# Patient Record
Sex: Male | Born: 2000 | Race: White | Hispanic: No | Marital: Single | Smoking: Current every day smoker
Health system: Southern US, Community
[De-identification: ages and names within clinical notes are randomized; demographics above are authoritative.]

## PROBLEM LIST (undated history)

## (undated) DIAGNOSIS — K253 Acute gastric ulcer without hemorrhage or perforation: Secondary | ICD-10-CM

## (undated) DIAGNOSIS — J45909 Unspecified asthma, uncomplicated: Secondary | ICD-10-CM

## (undated) HISTORY — PX: TONSILLECTOMY: SUR1361

---
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
## 2022-01-25 ENCOUNTER — Other Ambulatory Visit (HOSPITAL_COMMUNITY): Payer: Self-pay | Admitting: Physician Assistant

## 2022-01-25 DIAGNOSIS — F902 Attention-deficit hyperactivity disorder, combined type: Secondary | ICD-10-CM

## 2022-01-28 ENCOUNTER — Encounter (HOSPITAL_COMMUNITY): Payer: Self-pay

## 2022-01-28 ENCOUNTER — Ambulatory Visit (HOSPITAL_COMMUNITY)
Admission: EM | Admit: 2022-01-28 | Discharge: 2022-01-28 | Disposition: A | Discharge status: Home / Self Care | Payer: Medicaid Other | Attending: Nurse Practitioner | Admitting: Nurse Practitioner

## 2022-01-28 DIAGNOSIS — Z20822 Contact with and (suspected) exposure to covid-19: Secondary | ICD-10-CM | POA: Diagnosis not present

## 2022-01-28 DIAGNOSIS — J45909 Unspecified asthma, uncomplicated: Secondary | ICD-10-CM | POA: Diagnosis not present

## 2022-01-28 DIAGNOSIS — J069 Acute upper respiratory infection, unspecified: Secondary | ICD-10-CM | POA: Diagnosis not present

## 2022-01-28 DIAGNOSIS — F17210 Nicotine dependence, cigarettes, uncomplicated: Secondary | ICD-10-CM | POA: Insufficient documentation

## 2022-01-28 DIAGNOSIS — R519 Headache, unspecified: Secondary | ICD-10-CM | POA: Diagnosis present

## 2022-01-28 LAB — POC INFLUENZA A AND B ANTIGEN (URGENT CARE ONLY)
INFLUENZA A ANTIGEN, POC: NEGATIVE
INFLUENZA B ANTIGEN, POC: NEGATIVE

## 2022-01-28 MED ORDER — ACETAMINOPHEN 325 MG PO TABS
ORAL_TABLET | ORAL | Status: AC
  Filled 2022-01-28: qty 2

## 2022-01-28 MED ORDER — PROMETHAZINE-DM 6.25-15 MG/5ML PO SYRP: 5.0000 mL | ORAL_SOLUTION | Freq: Four times a day (QID) | ORAL | 0 refills | Status: AC | PRN

## 2022-01-28 MED ORDER — ALBUTEROL SULFATE HFA 108 (90 BASE) MCG/ACT IN AERS
2.0000 | INHALATION_SPRAY | Freq: Four times a day (QID) | RESPIRATORY_TRACT | 0 refills | Status: AC | PRN
Start: 2022-01-28 — End: 2023-01-28

## 2022-01-28 MED ORDER — ACETAMINOPHEN 325 MG PO TABS
650.0000 mg | ORAL_TABLET | Freq: Once | ORAL | Status: AC
  Administered 2022-01-28: 650 mg via ORAL

## 2022-01-28 NOTE — ED Provider Notes (Signed)
?[facility] ? ? ? ?CSN: [id number] ?Arrival date & time: 01/28/22  1345 ? ? ?  ? ?History   ?Chief Complaint ?Chief Complaint  ?Patient presents with  ? Eye Pain  ? Headache  ? ? ?HPI ?Johnny Manning is a 21 y.o. male.  ? ?Patient presents with 4 days of cough, fever, congestion, malaise, headache, runny nose, chest tightness and decreased appetite.  Also has scratchy throat.  Denies shortness of breath, chest pain, abdominal pain, nausea/vomiting, diarrhea, new rash, itchy/watery eyes. ? ?Reports history of asthma and has twins at home and is worried twins will become sick.  He has not taken anything for his symptoms so far.  History of tonsillectomy. ? ? ?Past Medical History:  ?Diagnosis Date  ? Asthma   ? Ulcer, gastric, acute   ? ? ?Patient Active Problem List  ? Diagnosis Date Noted  ? Drug overdose 01/25/2021  ? Polysubstance abuse (HCC) 01/25/2021  ? Anxiety 01/27/2020  ? Unspecified mood (affective) disorder (HCC)- need to rule out bipolar disorder 12/31/2019  ? Attention deficit hyperactivity disorder (ADHD), combined type, by history 12/31/2019  ? ? ?Past Surgical History:  ?Procedure Laterality Date  ? TONSILLECTOMY    ? ? ? ? ? ?Home Medications   ? ?Prior to Admission medications   ?Medication Sig Start Date End Date Taking? Authorizing Provider  ?promethazine-dextromethorphan (PROMETHAZINE-DM) 6.25-15 MG/5ML syrup Take 5 mLs by mouth 4 (four) times daily as needed for cough. 01/28/22  Yes Valentino Nose, NP  ?ADDERALL XR 25 MG 24 hr capsule Take 1 capsule by mouth daily. 12/26/21   Nwoko, Tommas Olp, PA  ?albuterol (VENTOLIN HFA) 108 (90 Base) MCG/ACT inhaler Inhale 2 puffs into the lungs every 6 (six) hours as needed for shortness of breath or wheezing. 01/28/22 01/28/23  Valentino Nose, NP  ?amphetamine-dextroamphetamine (ADDERALL XR) 25 MG 24 hr capsule Take 1 capsule by mouth every morning. 10/30/21   Nwoko, Tommas Olp, PA  ?busPIRone (BUSPAR) 30 MG tablet Take 1 tablet (30 mg total) by  mouth 2 (two) times daily. 10/25/21   Nwoko, Tommas Olp, PA  ?diphenhydrAMINE (BENADRYL) 25 mg capsule Take 50 mg by mouth every 6 (six) hours as needed.    [provider]  ?divalproex (DEPAKOTE) 250 MG DR tablet Take 1 tablet (250 mg total) by mouth 2 (two) times daily. 10/25/21   Meta Hatchet, PA  ?FLUoxetine (PROZAC) 10 MG capsule Patient to take 1 tablet (10 mg total) for the first 6 days then continue taking 2 tablets (20 mg total) daily. 10/25/21   Nwoko, Tommas Olp, PA  ?omeprazole (PRILOSEC) 20 MG capsule Take 20 mg by mouth daily as needed (indigestion).  12/11/19   [provider]  ?QUEtiapine (SEROQUEL) 200 MG tablet Take 1 tablet (200 mg total) by mouth at bedtime. 10/25/21   Meta Hatchet, PA  ? ? ?Family History ?History reviewed. No pertinent family history. ? ?Social History ?Social History  ? ?Tobacco Use  ? Smoking status: Every Day  ?  Types: Cigarettes  ? Smokeless tobacco: Never  ?Vaping Use  ? Vaping Use: Never used  ?Substance Use Topics  ? Alcohol use: Never  ? Drug use: Not Currently  ?  Types: Marijuana  ?  Comment: thc  ? ? ? ?Allergies   ?Patient has no known allergies. ? ? ?Review of Systems ?Review of Systems ?Per HPI ? ?Physical Exam ?Triage Vital Signs ?ED Triage Vitals [01/28/22 1423]  ?Enc Vitals Group  ?  BP 122/81  ?   Pulse Rate (!) 114  ?   Resp 18  ?   Temp 100.2 ?F (37.9 ?C)  ?   Temp Source Oral  ?   SpO2 94 %  ?   Weight   ?   Height   ?   Head Circumference   ?   Peak Flow   ?   Pain Score 1  ?   Pain Loc   ?   Pain Edu?   ?   Excl. in GC?   ? ?No data found. ? ?Updated Vital Signs ?BP 122/81 (BP Location: Right Arm)   Pulse (!) 114   Temp 100.2 ?F (37.9 ?C) (Oral)   Resp 18   SpO2 94%  ? ?Visual Acuity ?Right Eye Distance:   ?Left Eye Distance:   ?Bilateral Distance:   ? ?Right Eye Near:   ?Left Eye Near:    ?Bilateral Near:    ? ?Physical Exam ?Vitals and nursing note reviewed.  ?Constitutional:   ?   General: He is not in acute distress. ?    Appearance: Normal appearance. He is ill-appearing. He is not toxic-appearing.  ?HENT:  ?   Head: Normocephalic and atraumatic.  ?   Right Ear: Tympanic membrane, ear canal and external ear normal.  ?   Left Ear: Tympanic membrane, ear canal and external ear normal.  ?   Nose: Congestion and rhinorrhea present.  ?   Mouth/Throat:  ?   Mouth: Mucous membranes are moist.  ?   Pharynx: Oropharynx is clear. Posterior oropharyngeal erythema present. No oropharyngeal exudate.  ?Eyes:  ?   General: No scleral icterus. ?   Extraocular Movements: Extraocular movements intact.  ?Cardiovascular:  ?   Rate and Rhythm: Regular rhythm. Tachycardia present.  ?Pulmonary:  ?   Effort: Pulmonary effort is normal. No respiratory distress.  ?   Breath sounds: Normal breath sounds. No wheezing, rhonchi or rales.  ?Musculoskeletal:  ?   Cervical back: Normal range of motion and neck supple.  ?Lymphadenopathy:  ?   Cervical: Cervical adenopathy present.  ?Skin: ?   General: Skin is warm and dry.  ?   Coloration: Skin is not jaundiced or pale.  ?   Findings: No erythema or rash.  ?Neurological:  ?   Mental Status: He is alert and oriented to person, place, and time.  ?   Motor: No weakness.  ?Psychiatric:     ?   Mood and Affect: Mood normal.     ?   Behavior: Behavior normal.  ? ? ? ?UC Treatments / Results  ?Labs ?(all labs ordered are listed, but only abnormal results are displayed) ?Labs Reviewed  ?SARS CORONAVIRUS 2 (TAT 6-24 HRS)  ?POC INFLUENZA A AND B ANTIGEN (URGENT CARE ONLY)  ? ? ?EKG ? ? ?Radiology ?No results found. ? ?Procedures ?Procedures (including critical care time) ? ?Medications Ordered in UC ?Medications  ?acetaminophen (TYLENOL) tablet 650 mg (650 mg Oral Given 01/28/22 1441)  ? ? ?Initial Impression / Assessment and Plan / UC Course  ?I have reviewed the triage vital signs and the nursing notes. ? ?Pertinent labs & imaging results that were available during my care of the patient were reviewed by me and considered in  my medical decision making (see chart for details). ? ?  ?Symptoms are consistent with viral upper respiratory infection.  Check influenza and COVID-19 testing today.  Patient does not currently own a cell phone-notify mother with  positive results per patient request.  We discussed that symptoms may last 7-10 days before improvement. Encouraged supportive care. Increase fluid intake with water or electrolyte solution like pedialyte. Encouraged acetaminophen/ibuprofen as needed for fever/pain. Encouraged salt water gargling, chloraseptic spray and throat lozenges. Encouraged OTC guaifenesin. Encouraged saline sinus flushes and/or neti with humidified air.  Can use prescription cough syrup as every 6 hours as needed.  Seek care if symptoms do not improve over the next couple of days or if they worsen. ? ?Final Clinical Impressions(s) / UC Diagnoses  ? ?Final diagnoses:  ?Viral URI with cough  ? ? ? ?Discharge Instructions   ? ?  ?You tested negative for flu today.  We have tested you today for COVID also.  We will call your mom with positive results.    Please stay home and isolate until you are aware of the results.   ? ?Your symptoms and exam findings are most consistent with a viral upper respiratory infection. These usually run their course in about 10 days.  If your symptoms last longer than 10 days without improvement, please follow up with your primary care provider.  If your symptoms, worsen, please go to the Emergency Room.   ? ?Some things that can make you feel better are: ?- Increased rest ?- Increasing fluid with water/sugar free electrolytes ?- Acetaminophen and ibuprofen as needed for fever/pain.  ?- Salt water gargling, chloraseptic spray and throat lozenges ?- OTC guaifenesin (Mucinex).  ?- Saline sinus flushes or a neti pot.  ?- Humidifying the air. ? ?You can use the prescription cough medication to help with a dry cough; please do not take while driving or operating heavy machinery as it may make  you sleepy. ? ? ? ? ? ?ED Prescriptions   ? ? Medication Sig Dispense Auth. Provider  ? albuterol (VENTOLIN HFA) 108 (90 Base) MCG/ACT inhaler Inhale 2 puffs into the lungs every 6 (six) hours as needed for shortness

## 2022-01-28 NOTE — ED Triage Notes (Signed)
Pt presents with c/o chills and headaches X 3 days.  ? ?Pt states he has asthma and states he wants to make sure he does not have COVID.  ? ?States he has a severe cough that wakes him up at night.  ?

## 2022-01-28 NOTE — Discharge Instructions (Addendum)
You tested negative for flu today.  We have tested you today for COVID also.  We will call your mom with positive results.    Please stay home and isolate until you are aware of the results.   ? ?Your symptoms and exam findings are most consistent with a viral upper respiratory infection. These usually run their course in about 10 days.  If your symptoms last longer than 10 days without improvement, please follow up with your primary care provider.  If your symptoms, worsen, please go to the Emergency Room.   ? ?Some things that can make you feel better are: ?- Increased rest ?- Increasing fluid with water/sugar free electrolytes ?- Acetaminophen and ibuprofen as needed for fever/pain.  ?- Salt water gargling, chloraseptic spray and throat lozenges ?- OTC guaifenesin (Mucinex).  ?- Saline sinus flushes or a neti pot.  ?- Humidifying the air. ? ?You can use the prescription cough medication to help with a dry cough; please do not take while driving or operating heavy machinery as it may make you sleepy. ? ?

## 2022-01-29 LAB — SARS CORONAVIRUS 2 (TAT 6-24 HRS): SARS Coronavirus 2: NEGATIVE

## 2022-01-31 ENCOUNTER — Telehealth (HOSPITAL_COMMUNITY): Payer: Self-pay | Admitting: *Deleted

## 2022-01-31 NOTE — Telephone Encounter (Signed)
Call yesterday from [facility] re patients adderall rx. Patient needs an appt before will consider medicine renewals for him. Today his mom called thinking she had missed his appt today, appt is for tomorrow and will be able to keep it and can discuss at that time with his provider any meds. ?

## 2022-02-01 ENCOUNTER — Telehealth (HOSPITAL_COMMUNITY): Payer: Medicaid Other | Admitting: Physician Assistant

## 2022-02-01 ENCOUNTER — Encounter (HOSPITAL_COMMUNITY): Payer: Self-pay

## 2022-02-01 ENCOUNTER — Telehealth (HOSPITAL_COMMUNITY): Payer: Self-pay | Admitting: Physician Assistant

## 2022-02-01 NOTE — Telephone Encounter (Signed)
Patient's mother was informed that the patient must come in person in order for medication to be dispensed.

## 2022-02-01 NOTE — Telephone Encounter (Signed)
Provider attempted to call patient to initiate encounter; however, patient's mother picked up the phone instead.  Patient's mother informed provider that she was granted permission by the patient to conduct patient's encounter.  Provider informed patient's mother that patient needed to be present during his encounter.  Patient's mother was agitated over the fact that she is unable to conduct patient's encounter even though she is the mother.  Patient's mother still provided information on how well the patient was doing and informed the provider that he needs his Adderall.  Provider ended the encounter and discussed what transpired with Johnny and [name]. ? ?Provider reached out to patient's mother to inform her that patient must be present during the encounter for the encounter to legitimate.  She was also informed that patient's medications could not be dispensed unless the patient was seen.  Patient's mother then informed provider that she had reached the house and that the patient was present in the house.  She then informed the patient to speak on the phone.  Provider requested identifiers from the patient to which the "patient" appeared to stumble on when presenting the requested information.  Provider then informed the patient's mother that the patient needs to be seen face to face on a screen to conduct the appointment.  ? ?Once connection with the patient was established, provider told patient to show his face on the screen.  It was made apparent that the individual on the screen was the patient's brother person aiding the patient.  Provider then informed patient's mother that in order for patient to have an encounter and to receive his medications, he must present to the clinic in person and alone.   ? ?Provider contacted patient's mother via telephone to discuss when the patient could come in next. Visibly frustrated, patient's mother asked the soonest that the patient could be seen.  Provider  informed patient's mother that the patient could be seen tomorrow at 10 AM but must be seen alone and must come in person.  Patient's mother hung up the phone in anger when informed of the time patient can come in.

## 2022-02-01 NOTE — Telephone Encounter (Signed)
Patient's mother contacted the office regarding patients appointment and medications. Mother was informed by Clinical research associate that that information could not be provided as the patient does not have any documentation in chart stating that he is not his own guardian. Mother responded with "I am his mother, what do you mean? He is at work right now". Writer informed mother that she is unable to give any information out for this reasoning again. Mother became irritated and yelled, "what do you mean? Dr. Evelene Croon never had a problem speaking with me about his meds and appointments". Writer responded with the same answer, yet again. Mother responded "so you telling me he's not gonna his meds? Don't worry about, he's gonna get his damn meds". Call was ended. ?

## 2022-02-02 ENCOUNTER — Encounter (HOSPITAL_COMMUNITY): Payer: Medicaid Other | Admitting: Physician Assistant

## 2022-02-02 ENCOUNTER — Encounter (HOSPITAL_COMMUNITY): Payer: Self-pay

## 2022-03-02 ENCOUNTER — Other Ambulatory Visit (HOSPITAL_COMMUNITY): Payer: Self-pay | Admitting: Physician Assistant

## 2022-03-02 DIAGNOSIS — F419 Anxiety disorder, unspecified: Secondary | ICD-10-CM

## 2022-03-02 DIAGNOSIS — F39 Unspecified mood [affective] disorder: Secondary | ICD-10-CM

## 2022-04-03 ENCOUNTER — Encounter (HOSPITAL_COMMUNITY): Payer: Self-pay | Admitting: Physician Assistant

## 2022-04-03 ENCOUNTER — Ambulatory Visit (INDEPENDENT_AMBULATORY_CARE_PROVIDER_SITE_OTHER): Payer: Medicaid Other | Admitting: Physician Assistant

## 2022-04-03 DIAGNOSIS — F39 Unspecified mood [affective] disorder: Secondary | ICD-10-CM | POA: Diagnosis not present

## 2022-04-03 DIAGNOSIS — F419 Anxiety disorder, unspecified: Secondary | ICD-10-CM

## 2022-04-03 DIAGNOSIS — F902 Attention-deficit hyperactivity disorder, combined type: Secondary | ICD-10-CM | POA: Diagnosis not present

## 2022-04-03 MED ORDER — FLUOXETINE HCL 10 MG PO CAPS: ORAL_CAPSULE | ORAL | 2 refills | Status: DC

## 2022-04-03 MED ORDER — AMPHETAMINE-DEXTROAMPHET ER 25 MG PO CP24: 25.0000 mg | ORAL_CAPSULE | ORAL | 0 refills | Status: DC

## 2022-04-03 MED ORDER — BUSPIRONE HCL 30 MG PO TABS: 30.0000 mg | ORAL_TABLET | Freq: Two times a day (BID) | ORAL | 2 refills | Status: DC

## 2022-04-03 MED ORDER — QUETIAPINE FUMARATE 100 MG PO TABS: 100.0000 mg | ORAL_TABLET | Freq: Every day | ORAL | 2 refills | Status: DC

## 2022-04-03 MED ORDER — DIVALPROEX SODIUM 250 MG PO DR TAB: 250.0000 mg | DELAYED_RELEASE_TABLET | Freq: Two times a day (BID) | ORAL | 2 refills | Status: DC

## 2022-04-06 ENCOUNTER — Encounter (HOSPITAL_COMMUNITY): Payer: Self-pay | Admitting: Physician Assistant

## 2022-04-06 NOTE — Progress Notes (Signed)
BH MD/PA/NP OP Progress Note  04/06/2022 5:30 PM Johnny Manning  MRN:  [id number]  Chief Complaint:  Chief Complaint  Patient presents with   Medication Management   HPI:   Johnny Manning is a 21 year old male with a past psychiatric history significant for anxiety, attention deficit hyperactivity disorder, and unspecified mood (affective) disorder who presents to [facility] for follow-up of medication management.  Patient was last seen by this provider on 10/24/2021.  Patient is currently being managed on the following medications:  Buspirone 30 mg 2 times daily Fluoxetine 20 mg daily Adderall XR 25 mg daily Seroquel 200 mg at bedtime Divalproex 250 mg DR 2 times daily  Patient reports that he is out of some of his prescriptions.  He reports that he has been more sleepy and tired lately.  Patient believes that his sleepiness may be attributed to his Seroquel usage.  Patient is interested in decreasing his dosage of Seroquel from 200 mg to 100 mg at bedtime out of fear of overdosing on the medication.  Patient reports that his mood has been up and down and that he gets easily irritable on occasion.  Patient endorses depression stating that he feels like he is in a dark closet.  He states that he often feels like a weight is on his chest.  Patient endorses anxiety and rates his anxiety at 6 out of 10.  Patient's current stressors include being a new father and job.  Patient was asked if his fluoxetine was helpful in the management of his anxiety to which he replied that he had only been taking fluoxetine 10 mg daily and had not increased the medication.  A PHQ-9 screen was performed with the patient scoring a 19.  A GAD-7 screen was also performed with the patient scoring a 14.  Patient is alert and oriented x4, calm, cooperative, and fully engaged in conversation during the encounter.  Patient endorses feeling drowsy.  Patient denies suicidal or homicidal  ideations.  He currently denies auditory or visual hallucinations but states that he occasionally hears branches snapping at night and sees black figures in his peripheral field of vision.  Patient endorses fair sleep and receives on average 4 to 6 hours of sleep each night.  Patient endorses fair appetite and eats on average 1-2 meals per day.  Patient denies alcohol consumption and illicit drug use.  Patient endorses tobacco use and smokes on average 5 cigarettes/day.  Patient states that he is engaging in the use of delta 9.  Visit Diagnosis:    ICD-10-CM   1. Anxiety  F41.9 busPIRone (BUSPAR) 30 MG tablet    FLUoxetine (PROZAC) 10 MG capsule    2. Unspecified mood (affective) disorder (HCC)- need to rule out bipolar disorder  F39 divalproex (DEPAKOTE) 250 MG DR tablet    QUEtiapine (SEROQUEL) 100 MG tablet    FLUoxetine (PROZAC) 10 MG capsule    3. Attention deficit hyperactivity disorder (ADHD), combined type  F90.2 amphetamine-dextroamphetamine (ADDERALL XR) 25 MG 24 hr capsule      Past Psychiatric History:  Attention deficit hyperactivity disorder Anxiety Unspecified mood disorder  Past Medical History:  Past Medical History:  Diagnosis Date   Asthma    Ulcer, gastric, acute     Past Surgical History:  Procedure Laterality Date   TONSILLECTOMY      Family Psychiatric History:  Mother-has history of bipolar disorder, PTSD, OCD-she is currently taking Cymbalta, Tegretol, clonazepam, Seroquel, prazosin.  Older  sister has history of depression-is also taking Seroquel, Cymbalta, clonazepam.  Younger brother who is 57 years old has autism spectrum disorder.  Family History: History reviewed. No pertinent family history.  Social History:  Social History   Socioeconomic History   Marital status: Single    Spouse name: Not on file   Number of children: Not on file   Years of education: Not on file   Highest education level: Not on file  Occupational History   Occupation:  Student  Tobacco Use   Smoking status: Every Day    Types: Cigarettes   Smokeless tobacco: Never  Vaping Use   Vaping Use: Never used  Substance and Sexual Activity   Alcohol use: Never   Drug use: Not Currently    Types: Marijuana    Comment: thc   Sexual activity: Not Currently  Other Topics Concern   Not on file  Social History Narrative   Pt currently lives with mother and sister.  He is not followed by an outpatient psychiatrist.   Social Determinants of Health   Financial Resource Strain: Not on file  Food Insecurity: Not on file  Transportation Needs: Not on file  Physical Activity: Not on file  Stress: Not on file  Social Connections: Not on file    Allergies: No Known Allergies  Metabolic Disorder Labs: No results found for: "HGBA1C", "MPG" No results found for: "PROLACTIN" No results found for: "CHOL", "TRIG", "HDL", "CHOLHDL", "VLDL", "LDLCALC" No results found for: "TSH"  Therapeutic Level Labs: No results found for: "LITHIUM" Lab Results  Component Value Date   VALPROATE <10 (L) 01/25/2021   No results found for: "CBMZ"  Current Medications: Current Outpatient Medications  Medication Sig Dispense Refill   ADDERALL XR 25 MG 24 hr capsule Take 1 capsule by mouth daily. 30 capsule 0   albuterol (VENTOLIN HFA) 108 (90 Base) MCG/ACT inhaler Inhale 2 puffs into the lungs every 6 (six) hours as needed for shortness of breath or wheezing. 18 g 0   diphenhydrAMINE (BENADRYL) 25 mg capsule Take 50 mg by mouth every 6 (six) hours as needed.     omeprazole (PRILOSEC) 20 MG capsule Take 20 mg by mouth daily as needed (indigestion).      promethazine-dextromethorphan (PROMETHAZINE-DM) 6.25-15 MG/5ML syrup Take 5 mLs by mouth 4 (four) times daily as needed for cough. 118 mL 0   amphetamine-dextroamphetamine (ADDERALL XR) 25 MG 24 hr capsule Take 1 capsule by mouth every morning. 30 capsule 0   busPIRone (BUSPAR) 30 MG tablet Take 1 tablet (30 mg total) by mouth 2  (two) times daily. 60 tablet 2   divalproex (DEPAKOTE) 250 MG DR tablet Take 1 tablet (250 mg total) by mouth 2 (two) times daily. 60 tablet 2   FLUoxetine (PROZAC) 10 MG capsule Patient to take 1 tablet (10 mg total) for the first 6 days then continue taking 2 tablets (20 mg total) daily. 60 capsule 2   QUEtiapine (SEROQUEL) 100 MG tablet Take 1 tablet (100 mg total) by mouth at bedtime. 30 tablet 2   No current facility-administered medications for this visit.     Musculoskeletal: Strength & Muscle Tone: within normal limits Gait & Station: normal Patient leans: N/A  Psychiatric Specialty Exam: Review of Systems  Psychiatric/Behavioral:  Positive for decreased concentration, hallucinations and sleep disturbance. Negative for dysphoric mood, self-injury and suicidal ideas. The patient is nervous/anxious. The patient is not hyperactive.     Blood pressure 136/83, pulse 99, height 5\' 9"  (  1.753 m), weight 119 lb (54 kg).Body mass index is 17.57 kg/m.  General Appearance: Casual  Eye Contact:  Good  Speech:  Clear and Coherent and Normal Rate  Volume:  Normal  Mood:  Anxious and Depressed  Affect:  Congruent and Depressed  Thought Process:  Coherent, Goal Directed, and Descriptions of Associations: Intact  Orientation:  Full (Time, Place, and Person)  Thought Content: WDL and Hallucinations: Auditory Visual   Suicidal Thoughts:  No  Homicidal Thoughts:  No  Memory:  Immediate;   Good Recent;   Good Remote;   Good  Judgement:  Fair  Insight:  Fair  Psychomotor Activity:  Restlessness  Concentration:  Concentration: Good and Attention Span: Good  Recall:  Good  Fund of Knowledge: Good  Language: Good  Akathisia:  NA  Handed:  Right  AIMS (if indicated): not done  Assets:  Communication Skills Desire for Improvement Financial Resources/Insurance Housing Vocational/Educational  ADL's:  Intact  Cognition: WNL  Sleep:  Fair   Screenings: GAD-7    Flowsheet Row Office  Visit from 04/03/2022 in [facility] Video Visit from 10/24/2021 in [facility] Video Visit from 05/31/2021 in [facility]  Total GAD-7 Score 14 14 8       PHQ2-9    Flowsheet Row Office Visit from 04/03/2022 in [facility] Video Visit from 10/24/2021 in [facility] Video Visit from 05/31/2021 in [facility]  PHQ-2 Total Score 5 5 2   PHQ-9 Total Score 19 16 10       Flowsheet Row Office Visit from 04/03/2022 in [facility] ED from 01/28/2022 in [facility] Video Visit from 10/24/2021 in [facility]  C-SSRS RISK CATEGORY No Risk No Risk No Risk        Assessment and Plan:   Johnny Manning is a 21 year old male with a past psychiatric history significant for anxiety, attention deficit hyperactivity disorder, and unspecified mood (affective) disorder who presents to [facility] for follow-up of medication management.  Patient reports that he has ran out of some of his prescriptions since the last encounter.  He endorses depression characterized by fatigue, drowsiness, and irritability.  Patient also endorses anxiety.  Patient was recommended increasing his dosage of fluoxetine from 10 mg to 20 mg daily for the management of his depressive symptoms and anxiety.  Provider to decrease patient's Seroquel dosage from 200 mg to 100 mg at bedtime for the management of his mood.  All patient's medications to be e-prescribed to pharmacy of choice.  Collaboration of Care: Collaboration of Care: Medication Management AEB provider managing patient's psychiatric medications and Psychiatrist AEB patient being followed by mental health provider  Patient/Guardian was advised Release of Information must be obtained prior to any  record release in order to collaborate their care with an outside provider. Patient/Guardian was advised if they have not already done so to contact the registration department to sign all necessary forms in order for Teresa Coombs to release information regarding their care.   Consent: Patient/Guardian gives verbal consent for treatment and assignment of benefits for services provided during this visit. Patient/Guardian expressed understanding and agreed to proceed.   1. Anxiety  - busPIRone (BUSPAR) 30 MG tablet; Take 1 tablet (30 mg total) by mouth 2 (two) times daily.  Dispense: 60 tablet; Refill: 2 - FLUoxetine (PROZAC) 10 MG  capsule; Patient to take 1 tablet (10 mg total) for the first 6 days then continue taking 2 tablets (20 mg total) daily.  Dispense: 60 capsule; Refill: 2  2. Unspecified mood (affective) disorder (HCC)- need to rule out bipolar disorder  - divalproex (DEPAKOTE) 250 MG DR tablet; Take 1 tablet (250 mg total) by mouth 2 (two) times daily.  Dispense: 60 tablet; Refill: 2 - QUEtiapine (SEROQUEL) 100 MG tablet; Take 1 tablet (100 mg total) by mouth at bedtime.  Dispense: 30 tablet; Refill: 2 - FLUoxetine (PROZAC) 10 MG capsule; Patient to take 1 tablet (10 mg total) for the first 6 days then continue taking 2 tablets (20 mg total) daily.  Dispense: 60 capsule; Refill: 2  3. Attention deficit hyperactivity disorder (ADHD), combined type  - amphetamine-dextroamphetamine (ADDERALL XR) 25 MG 24 hr capsule; Take 1 capsule by mouth every morning.  Dispense: 30 capsule; Refill: 0  Patient to follow up in 2 months Provider spent a total of 18 minutes with the patient/reviewing patient's chart  Meta Hatchet, PA 04/06/2022, 5:30 PM

## 2022-05-01 ENCOUNTER — Other Ambulatory Visit (HOSPITAL_COMMUNITY): Payer: Self-pay | Admitting: Physician Assistant

## 2022-05-01 DIAGNOSIS — F902 Attention-deficit hyperactivity disorder, combined type: Secondary | ICD-10-CM

## 2022-05-02 ENCOUNTER — Other Ambulatory Visit (HOSPITAL_COMMUNITY): Payer: Self-pay | Admitting: Psychiatry

## 2022-05-02 ENCOUNTER — Telehealth (HOSPITAL_COMMUNITY): Payer: Self-pay | Admitting: *Deleted

## 2022-05-02 DIAGNOSIS — F902 Attention-deficit hyperactivity disorder, combined type: Secondary | ICD-10-CM

## 2022-05-02 MED ORDER — AMPHETAMINE-DEXTROAMPHET ER 25 MG PO CP24: ORAL_CAPSULE | ORAL | 0 refills | Status: DC

## 2022-05-02 NOTE — Telephone Encounter (Signed)
Medication refilled and sent to preferred pharmacy

## 2022-05-02 NOTE — Telephone Encounter (Signed)
Johnny Manning is Out of Office Sending Refill Request to Dr Leonard Schwartz. Johnny Manning  Refill Request for:  ADDERALL XR 25 MG 24 hr capsule Take 1 capsule by mouth daily

## 2022-05-28 ENCOUNTER — Other Ambulatory Visit (HOSPITAL_COMMUNITY): Payer: Self-pay | Admitting: Physician Assistant

## 2022-05-28 DIAGNOSIS — F39 Unspecified mood [affective] disorder: Secondary | ICD-10-CM

## 2022-05-28 DIAGNOSIS — F419 Anxiety disorder, unspecified: Secondary | ICD-10-CM

## 2022-06-02 ENCOUNTER — Other Ambulatory Visit (HOSPITAL_COMMUNITY): Payer: Self-pay | Admitting: Physician Assistant

## 2022-06-02 DIAGNOSIS — F902 Attention-deficit hyperactivity disorder, combined type: Secondary | ICD-10-CM

## 2022-06-05 ENCOUNTER — Encounter (HOSPITAL_COMMUNITY): Payer: Medicaid Other | Admitting: Physician Assistant

## 2022-06-05 ENCOUNTER — Other Ambulatory Visit (HOSPITAL_COMMUNITY): Payer: Self-pay | Admitting: Psychiatry

## 2022-06-05 ENCOUNTER — Encounter (HOSPITAL_COMMUNITY): Payer: Self-pay

## 2022-06-05 DIAGNOSIS — F902 Attention-deficit hyperactivity disorder, combined type: Secondary | ICD-10-CM

## 2022-06-06 ENCOUNTER — Ambulatory Visit (INDEPENDENT_AMBULATORY_CARE_PROVIDER_SITE_OTHER): Payer: Medicaid Other | Admitting: Physician Assistant

## 2022-06-06 DIAGNOSIS — F39 Unspecified mood [affective] disorder: Secondary | ICD-10-CM | POA: Diagnosis not present

## 2022-06-06 DIAGNOSIS — F902 Attention-deficit hyperactivity disorder, combined type: Secondary | ICD-10-CM | POA: Diagnosis not present

## 2022-06-06 DIAGNOSIS — F419 Anxiety disorder, unspecified: Secondary | ICD-10-CM | POA: Diagnosis not present

## 2022-06-06 MED ORDER — AMPHETAMINE-DEXTROAMPHET ER 25 MG PO CP24: 25.0000 mg | ORAL_CAPSULE | ORAL | 0 refills | Status: DC

## 2022-06-06 NOTE — Progress Notes (Unsigned)
BH MD/PA/NP OP Progress Note  Virtual Visit via Telephone Note  I connected with Johnny Manning on 06/07/22 at  9:30 AM EDT by telephone and verified that I am speaking with the correct person using two identifiers.  Location: Patient: Home Provider: Clinic   I discussed the limitations, risks, security and privacy concerns of performing an evaluation and management service by telephone and the availability of in person appointments. I also discussed with the patient that there may be a patient responsible charge related to this service. The patient expressed understanding and agreed to proceed.  Follow Up Instructions:  I discussed the assessment and treatment plan with the patient. The patient was provided an opportunity to ask questions and all were answered. The patient agreed with the plan and demonstrated an understanding of the instructions.   The patient was advised to call back or seek an in-person evaluation if the symptoms worsen or if the condition fails to improve as anticipated.  I provided 10 minutes of non-face-to-face time during this encounter.  Johnny Mood, PA   06/06/2022 3:17 PM BURWELL HOAGE  MRN:  [id number]  Chief Complaint:  No chief complaint on file.  HPI:   Johnny Manning is a 21 year old male with a past psychiatric history significant for anxiety, attention deficit hyperactivity disorder, and unspecified Manning (affective) disorder who presents to [facility] via virtual telephone visit for follow-up of medication management.  Patient was last seen in person on 04/06/2022.  Patient is currently being managed on the following medications:  Buspirone 30 mg 2 times daily Fluoxetine 20 mg daily Depakote 250 mg 2 times daily Adderall XR 25 mg daily Seroquel 100 mg at bedtime  Patient reports no issues or concerns regarding his current medication.  Patient denies experiencing any adverse side effects and states  that things have been casual for him as of late.  Patient denies experiencing depressive symptoms but states that anxiety is still an issue for him.  Patient rates his anxiety at an out of 10.  Patient denies any new stressors at this time.  A PHQ-9 screen was performed with the patient scoring a 10.  A GAD-7 screen was also performed with the patient scoring a 9.  Patient is alert and oriented x4, calm, cooperative, and fully engaged in conversation during the encounter.  Patient endorses calm Manning.  Patient denies suicidal or homicidal ideations.  He further denies auditory or visual hallucinations and does not appear to be responding to internal/external stimuli.  Patient endorses good sleep and receives on average 7 to 9 hours of sleep each night.  Patient endorses good appetite and eats on average 3 meals per day.  Patient denies alcohol consumption and illicit drug use.  Patient endorses tobacco use and smokes on average 5 cigarettes/day.  Visit Diagnosis:    ICD-10-CM   1. Anxiety  F41.9     2. Attention deficit hyperactivity disorder (ADHD), combined type  F90.2 amphetamine-dextroamphetamine (ADDERALL XR) 25 MG 24 hr capsule    3. Unspecified Manning (affective) disorder ([employer])- need to rule out bipolar disorder  F39       Past Psychiatric History:  Attention deficit hyperactivity disorder Anxiety Unspecified Manning disorder  Past Medical History:  Past Medical History:  Diagnosis Date   Asthma    Ulcer, gastric, acute     Past Surgical History:  Procedure Laterality Date   TONSILLECTOMY      Family Psychiatric History:  Mother-has history  of bipolar disorder, PTSD, OCD-she is currently taking Cymbalta, Tegretol, clonazepam, Seroquel, prazosin.  Older sister has history of depression-is also taking Seroquel, Cymbalta, clonazepam.  Younger brother who is 63 years old has autism spectrum disorder.  Family History: No family history on file.  Social History:  Social History    Socioeconomic History   Marital status: Single    Spouse name: Not on file   Number of children: Not on file   Years of education: Not on file   Highest education level: Not on file  Occupational History   Occupation: Student  Tobacco Use   Smoking status: Every Day    Types: Cigarettes   Smokeless tobacco: Never  Vaping Use   Vaping Use: Never used  Substance and Sexual Activity   Alcohol use: Never   Drug use: Not Currently    Types: Marijuana    Comment: thc   Sexual activity: Not Currently  Other Topics Concern   Not on file  Social History Narrative   Pt currently lives with mother and sister.  He is not followed by an outpatient psychiatrist.   Social Determinants of Health   Financial Resource Strain: Not on file  Food Insecurity: Not on file  Transportation Needs: Not on file  Physical Activity: Not on file  Stress: Not on file  Social Connections: Not on file    Allergies: No Known Allergies  Metabolic Disorder Labs: No results found for: "HGBA1C", "MPG" No results found for: "PROLACTIN" No results found for: "CHOL", "TRIG", "HDL", "CHOLHDL", "VLDL", "LDLCALC" No results found for: "TSH"  Therapeutic Level Labs: No results found for: "LITHIUM" Lab Results  Component Value Date   VALPROATE <10 (L) 01/25/2021   No results found for: "CBMZ"  Current Medications: Current Outpatient Medications  Medication Sig Dispense Refill   albuterol (VENTOLIN HFA) 108 (90 Base) MCG/ACT inhaler Inhale 2 puffs into the lungs every 6 (six) hours as needed for shortness of breath or wheezing. 18 g 0   amphetamine-dextroamphetamine (ADDERALL XR) 25 MG 24 hr capsule Take 1 capsule by mouth daily. 30 capsule 0   amphetamine-dextroamphetamine (ADDERALL XR) 25 MG 24 hr capsule Take 1 capsule by mouth every morning. 30 capsule 0   busPIRone (BUSPAR) 30 MG tablet Take 1 tablet (30 mg total) by mouth 2 (two) times daily. 60 tablet 2   diphenhydrAMINE (BENADRYL) 25 mg capsule  Take 50 mg by mouth every 6 (six) hours as needed.     divalproex (DEPAKOTE) 250 MG DR tablet Take 1 tablet (250 mg total) by mouth 2 (two) times daily. 60 tablet 2   FLUoxetine (PROZAC) 10 MG capsule Patient to take 1 tablet (10 mg total) for the first 6 days then continue taking 2 tablets (20 mg total) daily. 60 capsule 2   omeprazole (PRILOSEC) 20 MG capsule Take 20 mg by mouth daily as needed (indigestion).      promethazine-dextromethorphan (PROMETHAZINE-DM) 6.25-15 MG/5ML syrup Take 5 mLs by mouth 4 (four) times daily as needed for cough. 118 mL 0   QUEtiapine (SEROQUEL) 100 MG tablet Take 1 tablet (100 mg total) by mouth at bedtime. 30 tablet 2   No current facility-administered medications for this visit.     Musculoskeletal: Strength & Muscle Tone: within normal limits Gait & Station: normal Patient leans: N/A  Psychiatric Specialty Exam: Review of Systems  Psychiatric/Behavioral:  Positive for decreased concentration, hallucinations and sleep disturbance. Negative for dysphoric Manning, self-injury and suicidal ideas. The patient is nervous/anxious. The patient  is not hyperactive.     There were no vitals taken for this visit.There is no height or weight on file to calculate BMI.  General Appearance: Casual  Eye Contact:  Good  Speech:  Clear and Coherent and Normal Rate  Volume:  Normal  Manning:  Anxious and Depressed  Affect:  Congruent and Depressed  Thought Process:  Coherent, Goal Directed, and Descriptions of Associations: Intact  Orientation:  Full (Time, Place, and Person)  Thought Content: WDL and Hallucinations: Auditory Visual   Suicidal Thoughts:  No  Homicidal Thoughts:  No  Memory:  Immediate;   Good Recent;   Good Remote;   Good  Judgement:  Fair  Insight:  Fair  Psychomotor Activity:  Restlessness  Concentration:  Concentration: Good and Attention Span: Good  Recall:  Good  Fund of Knowledge: Good  Language: Good  Akathisia:  NA  Handed:  Right  AIMS  (if indicated): not done  Assets:  Communication Skills Desire for Improvement Financial Resources/Insurance Housing Vocational/Educational  ADL's:  Intact  Cognition: WNL  Sleep:  Fair   Screenings: GAD-7    Flowsheet Row Office Visit from 06/06/2022 in [facility] Office Visit from 04/03/2022 in [facility] Video Visit from 10/24/2021 in [facility] Video Visit from 05/31/2021 in [facility]  Total GAD-7 Score 9 14 14 8       PHQ2-9    Flowsheet Row Office Visit from 06/06/2022 in [facility] Office Visit from 04/03/2022 in [facility] Video Visit from 10/24/2021 in [facility] Video Visit from 05/31/2021 in [facility]  PHQ-2 Total Score 3 5 5 2   PHQ-9 Total Score 10 19 16 10       Flowsheet Row Office Visit from 06/06/2022 in [facility] Office Visit from 04/03/2022 in [facility] ED from 01/28/2022 in [facility] RISK CATEGORY No Risk No Risk No Risk        Assessment and Plan:   Sandro Burgo is a 21 year old male with a past psychiatric history significant for anxiety, attention deficit hyperactivity disorder, and unspecified Manning (affective) disorder who presents to [facility] via virtual telephone visit for follow-up and medication management.  Patient reports no issues or concerns regarding his current medication regimen.  Patient denies experiencing depressive symptoms but states that anxiety continues to be an issue for him.  Patient appears stable on his current medications at this time.  Patient is requesting refills on his Adderall following the conclusion of the encounter.  Patient's medication to be prescribed to  pharmacy of choice.  Collaboration of Care: Collaboration of Care: Medication Management AEB provider managing patient's psychiatric medications and Psychiatrist AEB patient being followed by mental health provider  Patient/Guardian was advised Release of Information must be obtained prior to any record release in order to collaborate their care with an outside provider. Patient/Guardian was advised if they have not already done so to contact the registration department to sign all necessary forms in order for Johnny Manning to release information regarding their care.   Consent: Patient/Guardian gives verbal consent for treatment and assignment of benefits for services provided during this visit. Patient/Guardian expressed understanding and agreed to proceed.   1. Attention deficit hyperactivity disorder (ADHD), combined type  - amphetamine-dextroamphetamine (ADDERALL XR) 25 MG 24 hr  capsule; Take 1 capsule by mouth every morning.  Dispense: 30 capsule; Refill: 0  2. Anxiety Patient to continue taking buspirone 30 mg 2 times daily for the management of his anxiety Patient to continue taking fluoxetine 20 mg daily for the management of his anxiety  3. Unspecified Manning (affective) disorder (HCC)- need to rule out bipolar disorder Patient to continue taking Depakote 250 mg 3 times daily for the management of his unspecified Manning disorder Patient to continue taking Seroquel 100 mg at bedtime for the management of his unspecified Manning disorder Patient to continue taking fluoxetine 20 mg daily for the management of his unspecified Manning disorder  Patient to follow up in 2 months Provider spent a total of 10 minutes with the patient/reviewing patient's chart  Meta Hatchet, PA 06/06/2022, 3:17 PM

## 2022-06-07 ENCOUNTER — Encounter (HOSPITAL_COMMUNITY): Payer: Self-pay | Admitting: Physician Assistant

## 2022-07-10 ENCOUNTER — Telehealth (HOSPITAL_COMMUNITY): Payer: Self-pay | Admitting: *Deleted

## 2022-07-10 ENCOUNTER — Other Ambulatory Visit (HOSPITAL_COMMUNITY): Payer: Self-pay | Admitting: Physician Assistant

## 2022-07-10 ENCOUNTER — Telehealth (HOSPITAL_COMMUNITY): Payer: Self-pay | Admitting: Orthopedic Surgery

## 2022-07-10 DIAGNOSIS — F902 Attention-deficit hyperactivity disorder, combined type: Secondary | ICD-10-CM

## 2022-07-10 MED ORDER — AMPHETAMINE-DEXTROAMPHET ER 25 MG PO CP24: ORAL_CAPSULE | ORAL | 0 refills | Status: DC

## 2022-07-10 MED ORDER — AMPHETAMINE-DEXTROAMPHET ER 25 MG PO CP24: 25.0000 mg | ORAL_CAPSULE | ORAL | 0 refills | Status: DC

## 2022-07-10 NOTE — Progress Notes (Signed)
Patient's medication was resent to preferred pharmacy.

## 2022-07-10 NOTE — Progress Notes (Signed)
Patient presented to the clinic requesting a refill on his Adderall medication.  Patient has a follow-up appointment scheduled with Dr. Sande Rives for 08/13/2022 at 2:00 PM.  Patient's medication to be e-prescribed to pharmacy of choice.

## 2022-07-10 NOTE — Telephone Encounter (Signed)
Showed up in person to ask for a rx for his adderall.. He has a future appt with Eddie PA on 08/09/22 and should be out of his adderall on or around 06/05/22. Will forward request for adderall to be sent in to his pharmacy by his provider.

## 2022-07-10 NOTE — Telephone Encounter (Signed)
Message acknowledged and reviewed. Patient has an appointment scheduled with [name] for 08/13/2022. Patient's medication to be e-prescribed to pharmacy of choice.

## 2022-07-10 NOTE — Telephone Encounter (Signed)
Message acknowledged and reviewed.

## 2022-08-09 ENCOUNTER — Telehealth (HOSPITAL_COMMUNITY): Payer: Medicaid Other | Admitting: Physician Assistant

## 2022-08-13 ENCOUNTER — Encounter (HOSPITAL_COMMUNITY): Payer: Self-pay | Admitting: Psychiatry

## 2022-08-13 ENCOUNTER — Telehealth (INDEPENDENT_AMBULATORY_CARE_PROVIDER_SITE_OTHER): Payer: Medicaid Other | Admitting: Psychiatry

## 2022-08-13 DIAGNOSIS — F902 Attention-deficit hyperactivity disorder, combined type: Secondary | ICD-10-CM | POA: Diagnosis not present

## 2022-08-13 DIAGNOSIS — F39 Unspecified mood [affective] disorder: Secondary | ICD-10-CM | POA: Diagnosis not present

## 2022-08-13 DIAGNOSIS — F419 Anxiety disorder, unspecified: Secondary | ICD-10-CM

## 2022-08-13 MED ORDER — DIVALPROEX SODIUM 250 MG PO DR TAB: 250.0000 mg | DELAYED_RELEASE_TABLET | Freq: Two times a day (BID) | ORAL | 2 refills | Status: DC

## 2022-08-13 MED ORDER — BUSPIRONE HCL 30 MG PO TABS: 30.0000 mg | ORAL_TABLET | Freq: Two times a day (BID) | ORAL | 2 refills | Status: DC

## 2022-08-13 MED ORDER — AMPHETAMINE-DEXTROAMPHET ER 25 MG PO CP24: 25.0000 mg | ORAL_CAPSULE | Freq: Every morning | ORAL | 0 refills | Status: DC

## 2022-08-13 MED ORDER — QUETIAPINE FUMARATE 50 MG PO TABS: 50.0000 mg | ORAL_TABLET | Freq: Every evening | ORAL | 2 refills | Status: DC
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
## 2022-08-13 NOTE — Patient Instructions (Addendum)
Thank you for attending your appointment today.  -- Continue Depakote, Buspar, and Adderall as prescribed -- DECREASE Seroquel to 50 mg nightly -- STOP Prozac -- Continue other medications as prescribed.  Please do not make any changes to medications without first discussing with your provider. If you are experiencing a psychiatric emergency, please call 911 or present to your nearest emergency department. Additional crisis, medication management, and therapy resources are included below.  [facility]  [address], [place], [place] [address] [phone] WALK-IN URGENT CARE 24/7 FOR ANYONE [address], [place], [place] Fax: [phone] [email or web address] *Interpreters available *Accepts all insurance and uninsured for Urgent Care needs *Accepts Medicaid and uninsured for outpatient treatment (below)      ONLY FOR [facility]  Below:    Outpatient New Patient Assessment/Therapy Walk-ins:        Monday -Thursday 8am until slots are full.        Every Friday 1pm-4pm  (first come, first served)                   New Patient Psychiatry/Medication Management        Monday-Friday 8am-11am (first come, first served)               For all walk-ins we ask that you arrive by 7:15am, because patients will be seen in the order of arrival.

## 2022-09-17 ENCOUNTER — Other Ambulatory Visit (HOSPITAL_COMMUNITY): Payer: Self-pay | Admitting: Physician Assistant

## 2022-09-17 DIAGNOSIS — F39 Unspecified mood [affective] disorder: Secondary | ICD-10-CM

## 2022-09-17 DIAGNOSIS — F419 Anxiety disorder, unspecified: Secondary | ICD-10-CM

## 2022-10-01 NOTE — Progress Notes (Unsigned)
BH MD Outpatient Progress Note  10/02/2022 5:56 PM Johnny Manning  MRN:  [id number]  Assessment:  Johnny Manning presents for follow-up evaluation. Today, 10/02/22, patient reports overall stability of mood and tolerated discontinuation of fluoxetine well with noted improvement in irritability as well as decrease in Seroquel well with improvement in daytime sedation.  Patient reflects that prior episodes of agitation, impulsivity, and mood/behavioral disturbance were typically in setting of substance use; will continue to explore this history in future visits to guide medication management.  Patient endorses diagnosis of ADHD made in childhood which also needs to be explored further given past substance use.  He was commended for decrease in delta 8 use and we discussed goal of abstinence from delta 8 in order to better assess ADHD diagnosis. Patient was understanding of expectation that further increase of stimulant would not be made with ongoing use of delta 8. No changes to medications at this time; plan to obtain updated labs as below.   Plan to RTC in 2 months; plan for coverage while this writer is on leave was discussed.   Identifying Information: Johnny Manning is a 21 y.o. male with a history of anxiety, poor impulse and anger control, ADHD, and past polysubstance use who is an established patient with [facility] participating in follow-up via video conferencing.   Plan:  # Anxiety # History of poor impulse and anger control Past medication trials: Cymbalta, fluoxetine (irritability) Status of problem: improving Interventions: -- Continue Seroquel 50 mg nightly (patient reports taking 25-50 mg nightly) -- Continue Depakote DR 250 mg BID -- Continue Buspar 30 mg BID  # Historical diagnosis of ADHD, combined type Past medication trials: unknown Status of problem: stable Interventions: -- Continue Adderall XR 25 mg daily  -- PDMP reviewed with appropriate  filling  # Delta 8 use, active # Past polysubstance use (fentanyl, heroin, cocaine, benzodiazepines, ecstasy - last used 2021) Status of problem: improving Interventions: -- Continue to provide education and promote reduction/cessation of use  # Medication monitoring Interventions: -- Depakote:  -- Updated CMP, CBC ordered today  -- No recent VPA level; plan to repeat at next in person visit as patient already took morning Depakote (reminded to hold at next visit) -- Seroquel:  -- Lipid profile wnl 06/16/21; no recent Hgb A1c; repeat today  Patient was given contact information for behavioral health clinic and was instructed to call 911 for emergencies.   Subjective:  Chief Complaint:  Chief Complaint  Patient presents with   Medication Management    Interval History:   Reports he has been doing well and enjoying his time with the twins.  Working as Therapist, occupational full time.  Tolerated discontinuation of Prozac well and feels irritability has been better. States Seroquel decrease was helpful with improvement in morning grogginess; has been taking 25-50 mg nightly; Sleeping about 4-6 hours nightly due to having to get up to take care of the twins. Denies trouble falling or staying asleep.  Endorses normal energy throughout the day.  Denies any recent impulsive or risky behaviors; altercations or fights.   Inquired more into patient's diagnosis of ADHD; reports he was first diagnosed with ADHD at 21 yo; denies neuropsychological testing. Finds Adderall helpful and typically takes M-Thurs and may take drug holidays Fri-Sun. Denies HA, chest pain, tachycardia, appetite decrease or weight loss.   States at times that he feels Adderall wearing off by the afternoon. Reports decrease in delta 8 use: Reports he has  only smoked 1 cart over the past 2 months.  Feels that since decreasing use, irritability has improved and that medication has been able to work more effectively.  Discussed importance  of continued decrease in use and goal of abstinence as this would also allow us to better understand mood symptoms and explore historical diagnosis of ADHD.  Discussed that further titration of Adderall would not be considered until abstinence from delta 8. and he expressed understanding.   PDMP:  -- Last filled Adderall ER 25 mg capsule QTY 30 on 09/14/22  Visit Diagnosis:    ICD-10-CM   1. Unspecified mood (affective) disorder (HCC)- need to rule out bipolar disorder  F39 divalproex (DEPAKOTE) 250 MG DR tablet    QUEtiapine (SEROQUEL) 50 MG tablet    2. High risk medication use  Z79.899 Lipid Profile    HgB A1c    CBC w/Diff/Platelet    Comprehensive Metabolic Panel (CMET)    3. Attention deficit hyperactivity disorder (ADHD), combined type, by history  F90.2 amphetamine-dextroamphetamine (ADDERALL XR) 25 MG 24 hr capsule    amphetamine-dextroamphetamine (ADDERALL XR) 25 MG 24 hr capsule    4. Anxiety  F41.9 busPIRone (BUSPAR) 30 MG tablet      Past Psychiatric History:  Diagnoses: anxiety, unspecified mood disorder, ADHD Substance use:   -- Cannabis: delta 8 pen - has decreased use and reports use of 1 cart over past 2 months  -- Tobacco: 5 cigarettes/day  -- Denies etoh or illicit drug use; states last use of illicit substances (other than cannabis) was in 2021 (at that time, using fentanyl, molly, xanax, cocaine)  Past Medical History:  Past Medical History:  Diagnosis Date   Asthma    Drug overdose 01/25/2021   Ulcer, gastric, acute     Past Surgical History:  Procedure Laterality Date   TONSILLECTOMY      Family Psychiatric History:  Mother- bipolar disorder, PTSD, OCD; taking Cymbalta, Tegretol, clonazepam, Seroquel, prazosin.   Older sister: depression; taking Seroquel, Cymbalta, clonazepam Younger brother: autism spectrum disorder  Family History: History reviewed. No pertinent family history.  Social History:  Social History   Socioeconomic History    Marital status: Single    Spouse name: Not on file   Number of children: Not on file   Years of education: Not on file   Highest education level: Not on file  Occupational History   Occupation: Student  Tobacco Use   Smoking status: Every Day    Types: Cigarettes   Smokeless tobacco: Never  Vaping Use   Vaping Use: Never used  Substance and Sexual Activity   Alcohol use: Never   Drug use: Yes    Types: Marijuana    Comment: current use of D8; past use of fentanyl/molly/xanax/cocaine (last used these substances 21 yo)   Sexual activity: Not Currently  Other Topics Concern   Not on file  Social History Narrative   Not on file   Social Determinants of Health   Financial Resource Strain: Not on file  Food Insecurity: Not on file  Transportation Needs: Not on file  Physical Activity: Not on file  Stress: Not on file  Social Connections: Not on file    Allergies: No Known Allergies  Current Medications: Current Outpatient Medications  Medication Sig Dispense Refill   albuterol (VENTOLIN HFA) 108 (90 Base) MCG/ACT inhaler Inhale 2 puffs into the lungs every 6 (six) hours as needed for shortness of breath or wheezing. 18 g 0   diphenhydrAMINE (BENADRYL)  25 mg capsule Take 50 mg by mouth every 6 (six) hours as needed.     omeprazole (PRILOSEC) 20 MG capsule Take 20 mg by mouth daily as needed (indigestion).      promethazine-dextromethorphan (PROMETHAZINE-DM) 6.25-15 MG/5ML syrup Take 5 mLs by mouth 4 (four) times daily as needed for cough. 118 mL 0   [START ON 10/14/2022] amphetamine-dextroamphetamine (ADDERALL XR) 25 MG 24 hr capsule Take 1 capsule by mouth in the morning. 30 capsule 0   [START ON 11/13/2022] amphetamine-dextroamphetamine (ADDERALL XR) 25 MG 24 hr capsule Take 1 capsule by mouth in the morning. 30 capsule 0   busPIRone (BUSPAR) 30 MG tablet Take 1 tablet (30 mg total) by mouth 2 (two) times daily. 60 tablet 2   divalproex (DEPAKOTE) 250 MG DR tablet Take 1  tablet (250 mg total) by mouth 2 (two) times daily. 60 tablet 2   QUEtiapine (SEROQUEL) 50 MG tablet Take 1 tablet (50 mg total) by mouth at bedtime. 30 tablet 2   No current facility-administered medications for this visit.    ROS: Denies physical complaints  Objective:  Psychiatric Specialty Exam: Blood pressure 128/81, pulse (!) 108, height 5\' 9"  (1.753 m), weight 117 lb (53.1 kg).Body mass index is 17.28 kg/m.  General Appearance: Casual and Fairly Groomed  Eye Contact:  Good  Speech:  Clear and Coherent and Normal Rate  Volume:  Normal  Mood:   "good"  Affect:  Congruent, Full Range, and Euthymic  Thought Content:  Denies AVH, IOR    Suicidal Thoughts:  No  Homicidal Thoughts:  No  Thought Process:  Goal Directed and Linear  Orientation:  Full (Time, Place, and Person)    Memory:   Grossly intact  Judgment:  Other:  Improving  Insight:   Improving  Concentration:  Concentration: Good  Recall:  NA  Fund of Knowledge: Good  Language: Good  Psychomotor Activity:  Normal  Akathisia:  Negative  AIMS (if indicated): not done  Assets:  Communication Skills Desire for Improvement Housing Intimacy Physical Health Social Support Transportation  ADL's:  Intact  Cognition: WNL  Sleep:  Good   PE: General: well-appearing; no acute distress  Pulm: no increased work of breathing on room air  Strength & Muscle Tone: within normal limits Neuro: no focal neurological deficits observed  Gait & Station: normal   Metabolic Disorder Labs: No results found for: "HGBA1C", "MPG" No results found for: "PROLACTIN" No results found for: "CHOL", "TRIG", "HDL", "CHOLHDL", "VLDL", "LDLCALC" No results found for: "TSH"  Therapeutic Level Labs: No results found for: "LITHIUM" Lab Results  Component Value Date   VALPROATE <10 (L) 01/25/2021   No results found for: "CBMZ"  Screenings:  GAD-7    Flowsheet Row Office Visit from 06/06/2022 in [facility] Office Visit from 04/03/2022 in [facility] Video Visit from 10/24/2021 in [facility] Video Visit from 05/31/2021 in [facility]  Total GAD-7 Score 9 14 14 8       PHQ2-9    Flowsheet Row Office Visit from 06/06/2022 in [facility] Office Visit from 04/03/2022 in [facility] Video Visit from 10/24/2021 in [facility] Video Visit from 05/31/2021 in [facility]  PHQ-2 Total Score 3 5 5 2   PHQ-9 Total Score 10 19 16 10       Flowsheet Row Office Visit from 06/06/2022 in [facility] Office Visit from 04/03/2022 in [facility] ED from 01/28/2022 in [facility] RISK CATEGORY No Risk No Risk No Risk       Collaboration of Care: Collaboration of Care: Medication Management AEB active medication changes and Psychiatrist AEB established with this provider  Patient/Guardian was advised Release of Information must be obtained prior to any record release in order to collaborate their care with an outside provider. Patient/Guardian was advised if they have not already done so to contact the registration department to sign all necessary forms in order for [place] to release information regarding their care.   Consent: Patient/Guardian gives verbal consent for treatment and assignment of benefits for services provided during this visit. Patient/Guardian expressed understanding and agreed to proceed.   A total of 40 minutes was spent involved in face to face clinical care, chart review, documentation, and medication management.   Farrah Skoda A  10/02/2022, 5:56 PM

## 2022-10-01 NOTE — Patient Instructions (Signed)
Thank you for attending your appointment today.  -- We did not make any medication changes today. Continue to decrease delta 8 use and recommend abstaining from use. -- Continue other medications as prescribed.  Please do not make any changes to medications without first discussing with your provider. If you are experiencing a psychiatric emergency, please call 911 or present to your nearest emergency department. Additional crisis, medication management, and therapy resources are included below.  [facility]  [address] [place], [place] [address] [phone] WALK-IN URGENT CARE 24/7 FOR ANYONE [address], [place], [place]  [phone] Fax: [phone] [email or web address] *Interpreters available *Accepts all insurance and uninsured for Urgent Care needs *Accepts Medicaid and uninsured for outpatient treatment (below)      ONLY FOR [facility]  Below:    Outpatient New Patient Assessment/Therapy Walk-ins:        Monday -Thursday 8am until slots are full.        Every Friday 1pm-4pm  (first come, first served)                   New Patient Psychiatry/Medication Management        Monday-Friday 8am-11am (first come, first served)               For all walk-ins we ask that you arrive by 7:15am, because patients will be seen in the order of arrival.

## 2022-10-02 ENCOUNTER — Ambulatory Visit (INDEPENDENT_AMBULATORY_CARE_PROVIDER_SITE_OTHER): Payer: Medicaid Other | Admitting: Psychiatry

## 2022-10-02 ENCOUNTER — Encounter (HOSPITAL_COMMUNITY): Payer: Self-pay | Admitting: Psychiatry

## 2022-10-02 ENCOUNTER — Other Ambulatory Visit (HOSPITAL_COMMUNITY): Payer: Self-pay | Admitting: Psychiatry

## 2022-10-02 VITALS — BP 128/81 | HR 108 | Ht 69.0 in | Wt 117.0 lb

## 2022-10-02 DIAGNOSIS — Z79899 Other long term (current) drug therapy: Secondary | ICD-10-CM

## 2022-10-02 DIAGNOSIS — F902 Attention-deficit hyperactivity disorder, combined type: Secondary | ICD-10-CM

## 2022-10-02 DIAGNOSIS — F39 Unspecified mood [affective] disorder: Secondary | ICD-10-CM

## 2022-10-02 DIAGNOSIS — F419 Anxiety disorder, unspecified: Secondary | ICD-10-CM

## 2022-10-02 MED ORDER — AMPHETAMINE-DEXTROAMPHET ER 25 MG PO CP24: 25.0000 mg | ORAL_CAPSULE | Freq: Every morning | ORAL | 0 refills | Status: DC

## 2022-10-02 MED ORDER — QUETIAPINE FUMARATE 50 MG PO TABS: 50.0000 mg | ORAL_TABLET | Freq: Every evening | ORAL | 2 refills | Status: DC

## 2022-10-02 MED ORDER — DIVALPROEX SODIUM 250 MG PO DR TAB: 250.0000 mg | DELAYED_RELEASE_TABLET | Freq: Two times a day (BID) | ORAL | 2 refills | Status: DC

## 2022-10-02 MED ORDER — AMPHETAMINE-DEXTROAMPHET ER 25 MG PO CP24: 25.0000 mg | ORAL_CAPSULE | Freq: Every morning | ORAL | 0 refills | Status: AC

## 2022-10-02 MED ORDER — BUSPIRONE HCL 30 MG PO TABS: 30.0000 mg | ORAL_TABLET | Freq: Two times a day (BID) | ORAL | 2 refills | Status: DC

## 2022-10-04 LAB — COMPREHENSIVE METABOLIC PANEL
ALT: 8 IU/L (ref 0–44)
AST: 9 IU/L (ref 0–40)
Albumin/Globulin Ratio: 2 (ref 1.2–2.2)
Albumin: 4.3 g/dL (ref 4.3–5.2)
Alkaline Phosphatase: 85 IU/L (ref 51–125)
BUN/Creatinine Ratio: 6 — ABNORMAL LOW (ref 9–20)
BUN: 6 mg/dL (ref 6–20)
Bilirubin Total: 0.2 mg/dL (ref 0.0–1.2)
CO2: 24 mmol/L (ref 20–29)
Calcium: 9.3 mg/dL (ref 8.7–10.2)
Chloride: 104 mmol/L (ref 96–106)
Creatinine, Ser: 0.93 mg/dL (ref 0.76–1.27)
Globulin, Total: 2.1 g/dL (ref 1.5–4.5)
Glucose: 85 mg/dL (ref 70–99)
Potassium: 4.1 mmol/L (ref 3.5–5.2)
Sodium: 143 mmol/L (ref 134–144)
Total Protein: 6.4 g/dL (ref 6.0–8.5)
eGFR: 121 mL/min/{1.73_m2} (ref >59)

## 2022-10-04 LAB — HEMOGLOBIN A1C
Est. average glucose Bld gHb Est-mCnc: 108 mg/dL
Hgb A1c MFr Bld: 5.4 % (ref 4.8–5.6)

## 2022-10-04 LAB — CBC WITH DIFFERENTIAL/PLATELET
Basophils Absolute: 0.1 10*3/uL (ref 0.0–0.2)
Basos: 1 %
EOS (ABSOLUTE): 0.6 10*3/uL — ABNORMAL HIGH (ref 0.0–0.4)
Eos: 9 %
Hematocrit: 41.9 % (ref 37.5–51.0)
Hemoglobin: 14.2 g/dL (ref 13.0–17.7)
Immature Grans (Abs): 0 10*3/uL (ref 0.0–0.1)
Immature Granulocytes: 0 %
Lymphocytes Absolute: 2.6 10*3/uL (ref 0.7–3.1)
Lymphs: 36 %
MCH: 30.3 pg (ref 26.6–33.0)
MCHC: 33.9 g/dL (ref 31.5–35.7)
MCV: 89 fL (ref 79–97)
Monocytes Absolute: 0.6 10*3/uL (ref 0.1–0.9)
Monocytes: 8 %
Neutrophils Absolute: 3.5 10*3/uL (ref 1.4–7.0)
Neutrophils: 46 %
Platelets: 296 10*3/uL (ref 150–450)
RBC: 4.69 x10E6/uL (ref 4.14–5.80)
RDW: 12.5 % (ref 11.6–15.4)
WBC: 7.4 10*3/uL (ref 3.4–10.8)

## 2022-10-04 LAB — LIPID PANEL
Chol/HDL Ratio: 2.8 ratio (ref 0.0–5.0)
Cholesterol, Total: 117 mg/dL (ref 100–169)
HDL: 42 mg/dL (ref >39)
LDL Chol Calc (NIH): 62 mg/dL (ref 0–109)
Triglycerides: 60 mg/dL (ref 0–89)
VLDL Cholesterol Cal: 13 mg/dL (ref 5–40)

## 2022-10-04 LAB — SPECIMEN STATUS REPORT

## 2022-10-05 ENCOUNTER — Other Ambulatory Visit (HOSPITAL_COMMUNITY): Payer: Self-pay | Admitting: Physician Assistant

## 2022-10-05 DIAGNOSIS — F419 Anxiety disorder, unspecified: Secondary | ICD-10-CM

## 2022-12-03 ENCOUNTER — Encounter (HOSPITAL_COMMUNITY): Payer: No Typology Code available for payment source | Admitting: Physician Assistant

## 2022-12-04 ENCOUNTER — Encounter (HOSPITAL_COMMUNITY): Payer: Self-pay | Admitting: Physician Assistant

## 2022-12-04 ENCOUNTER — Ambulatory Visit (INDEPENDENT_AMBULATORY_CARE_PROVIDER_SITE_OTHER): Payer: No Typology Code available for payment source | Admitting: Physician Assistant

## 2022-12-04 DIAGNOSIS — F39 Unspecified mood [affective] disorder: Secondary | ICD-10-CM

## 2022-12-04 DIAGNOSIS — F419 Anxiety disorder, unspecified: Secondary | ICD-10-CM | POA: Diagnosis not present

## 2022-12-04 MED ORDER — QUETIAPINE FUMARATE 50 MG PO TABS: 50.0000 mg | ORAL_TABLET | Freq: Every evening | ORAL | 2 refills | Status: DC

## 2022-12-04 MED ORDER — BUSPIRONE HCL 30 MG PO TABS: 30.0000 mg | ORAL_TABLET | Freq: Two times a day (BID) | ORAL | 2 refills | Status: AC

## 2022-12-04 MED ORDER — DIVALPROEX SODIUM 250 MG PO DR TAB: 250.0000 mg | DELAYED_RELEASE_TABLET | Freq: Two times a day (BID) | ORAL | 2 refills | Status: DC

## 2022-12-04 NOTE — Progress Notes (Unsigned)
BH MD/PA/NP OP Progress Note  12/04/2022 8:07 PM Johnny Manning  MRN:  [id number]  Chief Complaint:  Chief Complaint  Patient presents with   Follow-up   Medication Refill   HPI: ***  Johnny Manning  Visit Diagnosis:    ICD-10-CM   1. Anxiety  F41.9 busPIRone (BUSPAR) 30 MG tablet    2. Unspecified mood (affective) disorder ([place])- need to rule out bipolar disorder  F39 divalproex (DEPAKOTE) 250 MG DR tablet    QUEtiapine (SEROQUEL) 50 MG tablet      Past Psychiatric History:  Diagnoses: anxiety, unspecified mood disorder, ADHD Substance use:              -- Cannabis: delta 8 pen - has decreased use and reports use of 1 cart over past 2 months             -- Tobacco: 5 cigarettes/day             -- Denies etoh or illicit drug use; states last use of illicit substances (other than cannabis) was in 2021 (at that time, using fentanyl, molly, xanax, cocaine)  Past Medical History:  Past Medical History:  Diagnosis Date   Asthma    Drug overdose 01/25/2021   Ulcer, gastric, acute     Past Surgical History:  Procedure Laterality Date   TONSILLECTOMY      Family Psychiatric History:  Mother - bipolar disorder, PTSD, OCD; taking Cymbalta, Tegretol, clonazepam, Seroquel, prazosin.   Older sister - depression; taking Seroquel, Cymbalta, clonazepam Younger brother - autism spectrum disorder  Family History: History reviewed. No pertinent family history.  Social History:  Social History   Socioeconomic History   Marital status: Single    Spouse name: Not on file   Number of children: Not on file   Years of education: Not on file   Highest education level: Not on file  Occupational History   Occupation: Student  Tobacco Use   Smoking status: Every Day    Types: Cigarettes   Smokeless tobacco: Never  Vaping Use   Vaping Use: Never used  Substance and Sexual Activity   Alcohol use: Never   Drug use: Yes    Types: Marijuana    Comment: current use of D8; past use  of fentanyl/molly/xanax/cocaine (last used these substances 22 yo)   Sexual activity: Not Currently  Other Topics Concern   Not on file  Social History Narrative   Not on file   Social Determinants of Health   Financial Resource Strain: Not on file  Food Insecurity: Not on file  Transportation Needs: Not on file  Physical Activity: Not on file  Stress: Not on file  Social Connections: Not on file    Allergies: No Known Allergies  Metabolic Disorder Labs: Lab Results  Component Value Date   HGBA1C 5.4 10/02/2022   No results found for: "PROLACTIN" Lab Results  Component Value Date   CHOL 117 10/02/2022   TRIG 60 10/02/2022   HDL 42 10/02/2022   CHOLHDL 2.8 10/02/2022   LDLCALC 62 10/02/2022   No results found for: "TSH"  Therapeutic Level Labs: No results found for: "LITHIUM" Lab Results  Component Value Date   VALPROATE <10 (L) 01/25/2021   No results found for: "CBMZ"  Current Medications: Current Outpatient Medications  Medication Sig Dispense Refill   albuterol (VENTOLIN HFA) 108 (90 Base) MCG/ACT inhaler Inhale 2 puffs into the lungs every 6 (six) hours as needed for shortness of breath or  wheezing. 18 g 0   amphetamine-dextroamphetamine (ADDERALL XR) 25 MG 24 hr capsule Take 1 capsule by mouth in the morning. 30 capsule 0   amphetamine-dextroamphetamine (ADDERALL XR) 25 MG 24 hr capsule Take 1 capsule by mouth in the morning. 30 capsule 0   busPIRone (BUSPAR) 30 MG tablet Take 1 tablet (30 mg total) by mouth 2 (two) times daily. 60 tablet 2   diphenhydrAMINE (BENADRYL) 25 mg capsule Take 50 mg by mouth every 6 (six) hours as needed.     divalproex (DEPAKOTE) 250 MG DR tablet Take 1 tablet (250 mg total) by mouth 2 (two) times daily. 60 tablet 2   omeprazole (PRILOSEC) 20 MG capsule Take 20 mg by mouth daily as needed (indigestion).      promethazine-dextromethorphan (PROMETHAZINE-DM) 6.25-15 MG/5ML syrup Take 5 mLs by mouth 4 (four) times daily as needed for  cough. 118 mL 0   QUEtiapine (SEROQUEL) 50 MG tablet Take 1 tablet (50 mg total) by mouth at bedtime. 30 tablet 2   No current facility-administered medications for this visit.     Musculoskeletal: Strength & Muscle Tone: within normal limits Gait & Station: normal Patient leans: N/A  Psychiatric Specialty Exam: Review of Systems  Psychiatric/Behavioral:  Positive for sleep disturbance. Negative for decreased concentration, dysphoric mood, hallucinations, self-injury and suicidal ideas. The patient is nervous/anxious. The patient is not hyperactive.     There were no vitals taken for this visit.There is no height or weight on file to calculate BMI.  General Appearance: Casual  Eye Contact:  Good  Speech:  Clear and Coherent and Normal Rate  Volume:  Normal  Mood:  Anxious and Depressed  Affect:  Appropriate  Thought Process:  Coherent and Descriptions of Associations: Intact  Orientation:  Full (Time, Place, and Person)  Thought Content: WDL   Suicidal Thoughts:  No  Homicidal Thoughts:  No  Memory:  Immediate;   Good Recent;   Good Remote;   Good  Judgement:  Fair  Insight:  Fair  Psychomotor Activity:  Normal  Concentration:  Concentration: Good and Attention Span: Good  Recall:  Good  Fund of Knowledge: Good  Language: Good  Akathisia:  No  Handed:  Right  AIMS (if indicated): not done  Assets:  Communication Skills Desire for Improvement Financial Resources/Insurance Housing Vocational/Educational  ADL's:  Intact  Cognition: WNL  Sleep:  Fair   Screenings: GAD-7    Flowsheet Row Clinical Support from 12/04/2022 in [facility] Office Visit from 06/06/2022 in [facility] Office Visit from 04/03/2022 in [facility] Video Visit from 10/24/2021 in [facility] Video Visit from 05/31/2021 in [facility]  Total GAD-7 Score 10 9 14  14 8      $ PHQ2-9    Flowsheet Row Clinical Support from 12/04/2022 in [facility] Office Visit from 06/06/2022 in [facility] Office Visit from 04/03/2022 in [facility] Video Visit from 10/24/2021 in [facility] Video Visit from 05/31/2021 in [place]  PHQ-2 Total Score 4 3 5 5 2  $ PHQ-9 Total Score 11 10 19 16 10      $ Flowsheet Row Clinical Support from 12/04/2022 in [facility] Office Visit from 06/06/2022 in [facility] Office Visit from 04/03/2022 in [place] No Risk  No Risk No Risk        Assessment and Plan: ***    Collaboration of Care: Collaboration of Care: Medication Management AEB provider managing patient's psychiatric medications and Psychiatrist AEB patient being followed by mental health provider at this facility  Patient/Guardian was advised Release of Information must be obtained prior to any record release in order to collaborate their care with an outside provider. Patient/Guardian was advised if they have not already done so to contact the registration department to sign all necessary forms in order for [place] to release information regarding their care.   Consent: Patient/Guardian gives verbal consent for treatment and assignment of benefits for services provided during this visit. Patient/Guardian expressed understanding and agreed to proceed.   1. Anxiety  - busPIRone (BUSPAR) 30 MG tablet; Take 1 tablet (30 mg total) by mouth 2 (two) times daily.  Dispense: 60 tablet; Refill: 2  2. Unspecified mood (affective) disorder ([place])- need to rule out bipolar disorder  - divalproex (DEPAKOTE) 250 MG DR tablet; Take 1 tablet (250 mg total) by mouth 2 (two) times daily.  Dispense: 60 tablet; Refill: 2 - QUEtiapine (SEROQUEL)  50 MG tablet; Take 1 tablet (50 mg total) by mouth at bedtime.  Dispense: 30 tablet; Refill: 2  Patient to follow-up in 2 months Provider spent a total of 20 minutes with the patient/reviewing patient's chart  Malachy Mood, PA 12/04/2022, 8:07 PM

## 2022-12-07 ENCOUNTER — Other Ambulatory Visit (HOSPITAL_COMMUNITY): Payer: Self-pay | Admitting: Physician Assistant

## 2022-12-07 DIAGNOSIS — F39 Unspecified mood [affective] disorder: Secondary | ICD-10-CM

## 2023-01-31 ENCOUNTER — Encounter (HOSPITAL_COMMUNITY): Payer: 59 | Admitting: Physician Assistant

## 2023-02-13 ENCOUNTER — Other Ambulatory Visit (HOSPITAL_COMMUNITY): Payer: Self-pay | Admitting: Physician Assistant

## 2023-02-13 DIAGNOSIS — F902 Attention-deficit hyperactivity disorder, combined type: Secondary | ICD-10-CM

## 2023-02-14 ENCOUNTER — Encounter (HOSPITAL_COMMUNITY): Payer: 59 | Admitting: Physician Assistant

## 2023-02-15 ENCOUNTER — Encounter (HOSPITAL_COMMUNITY): Payer: 59 | Admitting: Physician Assistant

## 2023-04-19 ENCOUNTER — Encounter (HOSPITAL_COMMUNITY): Payer: Self-pay | Admitting: Physician Assistant

## 2023-04-19 ENCOUNTER — Ambulatory Visit (INDEPENDENT_AMBULATORY_CARE_PROVIDER_SITE_OTHER): Payer: 59 | Admitting: Physician Assistant

## 2023-04-19 VITALS — BP 129/80 | HR 62 | Temp 97.7°F | Ht 70.0 in | Wt 123.0 lb

## 2023-04-19 DIAGNOSIS — F902 Attention-deficit hyperactivity disorder, combined type: Secondary | ICD-10-CM | POA: Diagnosis not present

## 2023-04-19 DIAGNOSIS — F39 Unspecified mood [affective] disorder: Secondary | ICD-10-CM

## 2023-04-19 DIAGNOSIS — F419 Anxiety disorder, unspecified: Secondary | ICD-10-CM | POA: Diagnosis not present

## 2023-04-19 MED ORDER — AMPHETAMINE-DEXTROAMPHET ER 25 MG PO CP24
25.0000 mg | ORAL_CAPSULE | Freq: Every morning | ORAL | 0 refills | Status: DC
Start: 2023-04-19 — End: 2023-05-21

## 2023-04-19 MED ORDER — BUSPIRONE HCL 10 MG PO TABS
10.0000 mg | ORAL_TABLET | Freq: Two times a day (BID) | ORAL | 2 refills | Status: AC
Start: 2023-04-19 — End: ?

## 2023-04-19 MED ORDER — DIVALPROEX SODIUM 250 MG PO DR TAB
250.0000 mg | DELAYED_RELEASE_TABLET | Freq: Two times a day (BID) | ORAL | 2 refills | Status: AC
Start: 2023-04-19 — End: 2023-07-18

## 2023-04-19 MED ORDER — QUETIAPINE FUMARATE 50 MG PO TABS
50.0000 mg | ORAL_TABLET | Freq: Every evening | ORAL | 2 refills | Status: AC
Start: 2023-04-19 — End: 2023-07-18

## 2023-04-19 NOTE — Progress Notes (Signed)
BH MD/PA/NP OP Progress Note  04/19/2023 10:43 AM Johnny Manning  MRN:  [id number]  Chief Complaint:  Chief Complaint  Patient presents with   Follow-up   Medication Management   HPI:   Johnny Manning is a 22 year old Caucasian male with a past psychiatric history significant for anxiety, attention deficit hyperactivity disorder, and unspecified mood disorder who presents to [facility] for follow-up and medication management.  Patient was last seen by this provider on 12/04/2022.  During his last encounter, patient was being managed on the following psychiatric medications:  Seroquel 50 mg at bedtime Depakote DR 250 mg twice daily Buspirone 30 mg twice daily Adderall XR 25 mg daily  Patient reports that he has been out of his medications for roughly 2-1/2 weeks.  Since being out of his medications, patient has not experienced any instability with his mood.  Patient denies depressive symptoms at this time and further denies anxiety.  Patient denies any new stressors at this time and states that he recently acquired a new job that he likes.  A PHQ-9 screen was performed with the patient scoring a 10.  A GAD-7 screen was also performed with the patient scoring a 12.  Patient is alert and oriented x 4, calm, cooperative, and fully engaged in conversation during the encounter.  Patient endorses tired mood.  Patient denies suicidal or homicidal ideations.  He further denies auditory or visual hallucinations and does not appear to be responding to internal/external stimuli.  Patient endorses fair sleep and receives on average 4 to 5 hours of sleep per night.  Patient endorses fair appetite and eats on average 2 meals per day.  Patient denies alcohol consumption.  Patient denies tobacco use but does engage in vaping.  Patient denies illicit drug use.  Visit Diagnosis:    ICD-10-CM   1. Anxiety  F41.9 busPIRone (BUSPAR) 10 MG tablet    2. Unspecified mood  (affective) disorder (HCC)- need to rule out bipolar disorder  F39 QUEtiapine (SEROQUEL) 50 MG tablet    divalproex (DEPAKOTE) 250 MG DR tablet    3. Attention deficit hyperactivity disorder (ADHD), combined type, by history  F90.2 amphetamine-dextroamphetamine (ADDERALL XR) 25 MG 24 hr capsule      Past Psychiatric History:  Diagnoses: anxiety, unspecified mood disorder, ADHD Substance use:              -- Cannabis: delta 8 pen - has decreased use and reports use of 1 cart over past 2 months             -- Tobacco: 5 cigarettes/day             -- Denies etoh or illicit drug use; states last use of illicit substances (other than cannabis) was in 2021 (at that time, using fentanyl, molly, xanax, cocaine)  Past Medical History:  Past Medical History:  Diagnosis Date   Asthma    Drug overdose 01/25/2021   Ulcer, gastric, acute     Past Surgical History:  Procedure Laterality Date   TONSILLECTOMY      Family Psychiatric History:  Mother - bipolar disorder, PTSD, OCD; taking Cymbalta, Tegretol, clonazepam, Seroquel, prazosin.   Older sister - depression; taking Seroquel, Cymbalta, clonazepam Younger brother - autism spectrum disorder  Family History: History reviewed. No pertinent family history.  Social History:  Social History   Socioeconomic History   Marital status: Single    Spouse name: Not on file   Number of children:  Not on file   Years of education: Not on file   Highest education level: Not on file  Occupational History   Occupation: Student  Tobacco Use   Smoking status: Every Day    Types: Cigarettes   Smokeless tobacco: Never  Vaping Use   Vaping Use: Never used  Substance and Sexual Activity   Alcohol use: Never   Drug use: Yes    Types: Marijuana    Comment: current use of D8; past use of fentanyl/molly/xanax/cocaine (last used these substances 22 yo)   Sexual activity: Not Currently  Other Topics Concern   Not on file  Social History Narrative    Not on file   Social Determinants of Health   Financial Resource Strain: Not on file  Food Insecurity: Not on file  Transportation Needs: Not on file  Physical Activity: Not on file  Stress: Not on file  Social Connections: Not on file    Allergies: No Known Allergies  Metabolic Disorder Labs: Lab Results  Component Value Date   HGBA1C 5.4 10/02/2022   No results found for: "PROLACTIN" Lab Results  Component Value Date   CHOL 117 10/02/2022   TRIG 60 10/02/2022   HDL 42 10/02/2022   CHOLHDL 2.8 10/02/2022   LDLCALC 62 10/02/2022   No results found for: "TSH"  Therapeutic Level Labs: No results found for: "LITHIUM" Lab Results  Component Value Date   VALPROATE <10 (L) 01/25/2021   No results found for: "CBMZ"  Current Medications: Current Outpatient Medications  Medication Sig Dispense Refill   busPIRone (BUSPAR) 10 MG tablet Take 1 tablet (10 mg total) by mouth 2 (two) times daily. 60 tablet 2   albuterol (VENTOLIN HFA) 108 (90 Base) MCG/ACT inhaler Inhale 2 puffs into the lungs every 6 (six) hours as needed for shortness of breath or wheezing. 18 g 0   amphetamine-dextroamphetamine (ADDERALL XR) 25 MG 24 hr capsule Take 1 capsule by mouth in the morning. 30 capsule 0   amphetamine-dextroamphetamine (ADDERALL XR) 25 MG 24 hr capsule Take 1 capsule by mouth in the morning. 30 capsule 0   diphenhydrAMINE (BENADRYL) 25 mg capsule Take 50 mg by mouth every 6 (six) hours as needed.     divalproex (DEPAKOTE) 250 MG DR tablet Take 1 tablet (250 mg total) by mouth 2 (two) times daily. 60 tablet 2   FLUoxetine (PROZAC) 10 MG capsule TAKE ONE CAPSULE BY MOUTH EVERY DAY FOR SIX DAYS, THEN CONTINUE TO TAKE TWO CAPSULES EVERY DAY 60 capsule 2   omeprazole (PRILOSEC) 20 MG capsule Take 20 mg by mouth daily as needed (indigestion).      promethazine-dextromethorphan (PROMETHAZINE-DM) 6.25-15 MG/5ML syrup Take 5 mLs by mouth 4 (four) times daily as needed for cough. 118 mL 0    QUEtiapine (SEROQUEL) 50 MG tablet Take 1 tablet (50 mg total) by mouth at bedtime. 30 tablet 2   No current facility-administered medications for this visit.     Musculoskeletal: Strength & Muscle Tone: within normal limits Gait & Station: normal Patient leans: N/A  Psychiatric Specialty Exam: Review of Systems  Psychiatric/Behavioral:  Positive for sleep disturbance. Negative for decreased concentration, dysphoric mood, hallucinations, self-injury and suicidal ideas. The patient is not nervous/anxious and is not hyperactive.     There were no vitals taken for this visit.There is no height or weight on file to calculate BMI.  General Appearance: Casual  Eye Contact:  Good  Speech:  Clear and Coherent and Normal Rate  Volume:  Normal  Mood:  Euthymic  Affect:  Appropriate  Thought Process:  Coherent, Goal Directed, and Descriptions of Associations: Intact  Orientation:  Full (Time, Place, and Person)  Thought Content: WDL   Suicidal Thoughts:  No  Homicidal Thoughts:  No  Memory:  Immediate;   Good Recent;   Good Remote;   Good  Judgement:  Fair  Insight:  Fair  Psychomotor Activity:  Normal  Concentration:  Concentration: Good and Attention Span: Good  Recall:  Good  Fund of Knowledge: Good  Language: Good  Akathisia:  No  Handed:  Right  AIMS (if indicated): not done  Assets:  Communication Skills Desire for Improvement Financial Resources/Insurance Housing Vocational/Educational  ADL's:  Intact  Cognition: WNL  Sleep:  Fair   Screenings: GAD-7    Flowsheet Row Clinical Support from 04/19/2023 in [facility] Clinical Support from 12/04/2022 in [facility] Office Visit from 06/06/2022 in [facility] Office Visit from 04/03/2022 in [facility] Video Visit from 10/24/2021 in [facility]  Total GAD-7 Score 12 10 9 14 14        PHQ2-9    Flowsheet Row Clinical Support from 04/19/2023 in [facility] Clinical Support from 12/04/2022 in [facility] Office Visit from 06/06/2022 in [facility] Office Visit from 04/03/2022 in [facility] Video Visit from 10/24/2021 in [facility]  PHQ-2 Total Score 2 4 3 5 5   PHQ-9 Total Score 10 11 10 19 16       Flowsheet Row Clinical Support from 04/19/2023 in [facility] Clinical Support from 12/04/2022 in [facility] Office Visit from 06/06/2022 in [facility]  C-SSRS RISK CATEGORY No Risk No Risk No Risk        Assessment and Plan:   Johnny Manning is a 22 year old Caucasian male with a past psychiatric history significant for anxiety, attention deficit hyperactivity disorder, and unspecified mood disorder who presents to [facility] for follow-up and medication management.  Patient reports that he has been out of his current medication regimen for roughly 2-1/2 weeks but denies any instability in his mood.  Patient denies depressive episodes or anxiety at this time.  Patient would like to be placed back on his current medication regimen, but states that his previous dosage of buspirone makes him feel strange.  Patient reports that he felt more normal when taking 10 mg of buspirone.  Provider to place patient on buspirone 10 mg 2 times daily for the management of his anxiety.  Patient to continue taking all other medications as prescribed.  Patient medications to be e-prescribed to pharmacy of choice.  Collaboration of Care: Collaboration of Care: Medication Management AEB provider managing patient's psychiatric medications and Psychiatrist AEB patient being followed by mental health provider at this  facility  Patient/Guardian was advised Release of Information must be obtained prior to any record release in order to collaborate their care with an outside provider. Patient/Guardian was advised if they have not already done so to contact the registration department to sign all necessary forms in order for [place] to release information regarding their care.   Consent: Patient/Guardian gives verbal consent for treatment and assignment of benefits for services provided during this visit. Patient/Guardian expressed understanding and agreed to proceed.   1. Unspecified  mood (affective) disorder (HCC)- need to rule out bipolar disorder  - QUEtiapine (SEROQUEL) 50 MG tablet; Take 1 tablet (50 mg total) by mouth at bedtime.  Dispense: 30 tablet; Refill: 2 - divalproex (DEPAKOTE) 250 MG DR tablet; Take 1 tablet (250 mg total) by mouth 2 (two) times daily.  Dispense: 60 tablet; Refill: 2  2. Attention deficit hyperactivity disorder (ADHD), combined type, by history  - amphetamine-dextroamphetamine (ADDERALL XR) 25 MG 24 hr capsule; Take 1 capsule by mouth in the morning.  Dispense: 30 capsule; Refill: 0  3. Anxiety  - busPIRone (BUSPAR) 10 MG tablet; Take 1 tablet (10 mg total) by mouth 2 (two) times daily.  Dispense: 60 tablet; Refill: 2  Patient to follow-up in 7 weeks Provider spent a total of 10 minutes with the patient Caesarean patient's chart  Meta Hatchet, PA 04/19/2023, 10:43 AM

## 2023-04-25 ENCOUNTER — Ambulatory Visit
Admission: EM | Admit: 2023-04-25 | Discharge: 2023-04-25 | Disposition: A | Discharge status: Home / Self Care | Payer: 59 | Attending: Family Medicine | Admitting: Family Medicine

## 2023-04-25 DIAGNOSIS — Z1152 Encounter for screening for COVID-19: Secondary | ICD-10-CM | POA: Diagnosis not present

## 2023-04-25 DIAGNOSIS — Z8669 Personal history of other diseases of the nervous system and sense organs: Secondary | ICD-10-CM | POA: Insufficient documentation

## 2023-04-25 DIAGNOSIS — R519 Headache, unspecified: Secondary | ICD-10-CM | POA: Insufficient documentation

## 2023-04-25 MED ORDER — KETOROLAC TROMETHAMINE 30 MG/ML IJ SOLN
30.0000 mg | Freq: Once | INTRAMUSCULAR | Status: AC
  Administered 2023-04-25: 30 mg via INTRAMUSCULAR

## 2023-04-25 NOTE — ED Provider Notes (Signed)
[facility]    CSN: [id number] Arrival date & time: 04/25/23  1522      History   Chief Complaint No chief complaint on file.   HPI Braxden Hainline Neiswonger is a 22 y.o. male.   Presenting today with 3-day history of nausea, significant headache.  States the headache is behind his eyes and temples.  Some light sensitivity, no dizziness, mental status changes, vomiting, head injury, vision changes.  States he has a history of migraines that have felt similarly.  So far not tried anything over-the-counter for symptoms.  Requesting testing for COVID today.    Past Medical History:  Diagnosis Date   Asthma    Drug overdose 01/25/2021   Ulcer, gastric, acute     Patient Active Problem List   Diagnosis Date Noted   Polysubstance abuse (HCC) 01/25/2021   Anxiety 01/27/2020   Unspecified mood (affective) disorder (HCC)- need to rule out bipolar disorder 12/31/2019   Attention deficit hyperactivity disorder (ADHD), combined type, by history 12/31/2019    Past Surgical History:  Procedure Laterality Date   TONSILLECTOMY         Home Medications    Prior to Admission medications   Medication Sig Start Date End Date Taking? Authorizing Provider  albuterol (VENTOLIN HFA) 108 (90 Base) MCG/ACT inhaler Inhale 2 puffs into the lungs every 6 (six) hours as needed for shortness of breath or wheezing. 01/28/22 01/28/23  Valentino Nose, NP  amphetamine-dextroamphetamine (ADDERALL XR) 25 MG 24 hr capsule Take 1 capsule by mouth in the morning. 10/14/22 11/13/22  Bahraini, Sarah A  amphetamine-dextroamphetamine (ADDERALL XR) 25 MG 24 hr capsule Take 1 capsule by mouth in the morning. 04/19/23 05/19/23  Nwoko, Tommas Olp, PA  busPIRone (BUSPAR) 10 MG tablet Take 1 tablet (10 mg total) by mouth 2 (two) times daily. 04/19/23   Nwoko, Tommas Olp, PA  diphenhydrAMINE (BENADRYL) 25 mg capsule Take 50 mg by mouth every 6 (six) hours as needed.    [provider]  divalproex (DEPAKOTE) 250  MG DR tablet Take 1 tablet (250 mg total) by mouth 2 (two) times daily. 04/19/23 07/18/23  Nwoko, Stephens Shire E, PA  FLUoxetine (PROZAC) 10 MG capsule TAKE ONE CAPSULE BY MOUTH EVERY DAY FOR SIX DAYS, THEN CONTINUE TO TAKE TWO CAPSULES EVERY DAY 12/07/22   Nwoko, Tommas Olp, PA  omeprazole (PRILOSEC) 20 MG capsule Take 20 mg by mouth daily as needed (indigestion).  12/11/19   [provider]  promethazine-dextromethorphan (PROMETHAZINE-DM) 6.25-15 MG/5ML syrup Take 5 mLs by mouth 4 (four) times daily as needed for cough. 01/28/22   Valentino Nose, NP  QUEtiapine (SEROQUEL) 50 MG tablet Take 1 tablet (50 mg total) by mouth at bedtime. 04/19/23 07/18/23  Meta Hatchet, PA    Family History History reviewed. No pertinent family history.  Social History Social History   Tobacco Use   Smoking status: Every Day    Types: Cigarettes   Smokeless tobacco: Never  Vaping Use   Vaping Use: Never used  Substance Use Topics   Alcohol use: Never   Drug use: Yes    Types: Marijuana    Comment: current use of D8; past use of fentanyl/molly/xanax/cocaine (last used these substances 22 yo)     Allergies   Patient has no known allergies.   Review of Systems Review of Systems PER HPI  Physical Exam Triage Vital Signs ED Triage Vitals [04/25/23 1559]  Enc Vitals Group     BP 133/74  Pulse Rate (!) 105     Resp 20     Temp 98.4 F (36.9 C)     Temp Source Oral     SpO2 96 %     Weight      Height      Head Circumference      Peak Flow      Pain Score 3     Pain Loc      Pain Edu?      Excl. in GC?    No data found.  Updated Vital Signs BP 133/74 (BP Location: Right Arm)   Pulse (!) 105   Temp 98.4 F (36.9 C) (Oral)   Resp 20   SpO2 96%   Visual Acuity Right Eye Distance:   Left Eye Distance:   Bilateral Distance:    Right Eye Near:   Left Eye Near:    Bilateral Near:     Physical Exam Vitals and nursing note reviewed.  Constitutional:      Appearance:  Normal appearance.  HENT:     Head: Atraumatic.     Right Ear: Tympanic membrane normal.     Left Ear: Tympanic membrane normal.     Nose: Nose normal.     Mouth/Throat:     Mouth: Mucous membranes are moist.  Eyes:     Extraocular Movements: Extraocular movements intact.     Conjunctiva/sclera: Conjunctivae normal.  Cardiovascular:     Rate and Rhythm: Normal rate and regular rhythm.  Pulmonary:     Effort: Pulmonary effort is normal.     Breath sounds: Normal breath sounds. No wheezing or rales.  Musculoskeletal:        General: Normal range of motion.     Cervical back: Normal range of motion and neck supple.  Skin:    General: Skin is warm and dry.  Neurological:     General: No focal deficit present.     Mental Status: He is oriented to person, place, and time.     Cranial Nerves: No cranial nerve deficit.     Motor: No weakness.     Gait: Gait normal.  Psychiatric:        Mood and Affect: Mood normal.        Thought Content: Thought content normal.        Judgment: Judgment normal.    UC Treatments / Results  Labs (all labs ordered are listed, but only abnormal results are displayed) Labs Reviewed  SARS CORONAVIRUS 2 (TAT 6-24 HRS)    EKG   Radiology No results found.  Procedures Procedures (including critical care time)  Medications Ordered in UC Medications  ketorolac (TORADOL) 30 MG/ML injection 30 mg (30 mg Intramuscular Given 04/25/23 1641)    Initial Impression / Assessment and Plan / UC Course  I have reviewed the triage vital signs and the nursing notes.  Pertinent labs & imaging results that were available during my care of the patient were reviewed by me and considered in my medical decision making (see chart for details).     Minimally tachycardic in triage, otherwise vital signs and exam reassuring with no focal neurologic deficits.  Will treat headache which is possibly migrainous in nature with IM Toradol and Tylenol as needed, patient  requesting screening for COVID today as well.  This was performed.  Return for worsening symptoms.  Final Clinical Impressions(s) / UC Diagnoses   Final diagnoses:  Acute nonintractable headache, unspecified headache type  History of migraine  Encounter for screening for COVID-19   Discharge Instructions   None    ED Prescriptions   None    PDMP not reviewed this encounter.   Particia Nearing, [place] 04/25/23 1645

## 2023-04-25 NOTE — ED Triage Notes (Signed)
Pt reports he has an upset stomach and severe headaches x 3 days.     Hxs of migraines.

## 2023-04-26 LAB — SARS CORONAVIRUS 2 (TAT 6-24 HRS): SARS Coronavirus 2: NEGATIVE

## 2023-05-20 ENCOUNTER — Telehealth (HOSPITAL_COMMUNITY): Payer: Self-pay | Admitting: Physician Assistant

## 2023-05-21 ENCOUNTER — Other Ambulatory Visit (HOSPITAL_COMMUNITY): Payer: Self-pay | Admitting: Physician Assistant

## 2023-05-21 DIAGNOSIS — F902 Attention-deficit hyperactivity disorder, combined type: Secondary | ICD-10-CM

## 2023-05-21 MED ORDER — AMPHETAMINE-DEXTROAMPHET ER 25 MG PO CP24
25.0000 mg | ORAL_CAPSULE | Freq: Every morning | ORAL | 0 refills | Status: AC
Start: 2023-05-21 — End: 2023-06-20

## 2023-05-21 NOTE — Progress Notes (Signed)
Provider was contacted by Darolyn Rua regarding patient's request for medication refill.  Patient's medication to be e-prescribed to pharmacy of choice.

## 2023-05-21 NOTE — Telephone Encounter (Signed)
Message acknowledged and reviewed.

## 2023-06-12 ENCOUNTER — Encounter (HOSPITAL_COMMUNITY): Payer: 59 | Admitting: Physician Assistant

## 2023-06-18 ENCOUNTER — Ambulatory Visit (INDEPENDENT_AMBULATORY_CARE_PROVIDER_SITE_OTHER): Payer: 59

## 2023-06-18 ENCOUNTER — Ambulatory Visit
Admission: RE | Admit: 2023-06-18 | Discharge: 2023-06-18 | Disposition: A | Discharge status: Home / Self Care | Payer: 59 | Source: Ambulatory Visit | Attending: Nurse Practitioner | Admitting: Nurse Practitioner

## 2023-06-18 VITALS — BP 137/76 | HR 104 | Temp 97.6°F | Resp 14

## 2023-06-18 DIAGNOSIS — M79671 Pain in right foot: Secondary | ICD-10-CM | POA: Diagnosis not present

## 2023-06-18 DIAGNOSIS — M79672 Pain in left foot: Secondary | ICD-10-CM
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
## 2023-06-18 NOTE — Discharge Instructions (Addendum)
The x-rays are negative for fracture or dislocation. May take over-the-counter Tylenol or ibuprofen as needed for pain or discomfort. RICE therapy, rest, ice, compression, and elevation.  Apply ice for 20 minutes, remove for 1 hour, repeat as needed. If symptoms are not improving over the next 2 to 3 weeks, it is recommended that you follow-up with orthopedics for further evaluation.  You can follow-up with [facility] [place] or with [facility]. Follow-up as needed.
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
## 2023-12-13 ENCOUNTER — Other Ambulatory Visit (HOSPITAL_COMMUNITY): Payer: Self-pay | Admitting: Physician Assistant

## 2023-12-13 DIAGNOSIS — F39 Unspecified mood [affective] disorder: Secondary | ICD-10-CM

## 2023-12-13 DIAGNOSIS — F419 Anxiety disorder, unspecified: Secondary | ICD-10-CM

## 2024-10-26 ENCOUNTER — Emergency Department (HOSPITAL_COMMUNITY)
Admission: EM | Admit: 2024-10-26 | Discharge: 2024-10-26 | Disposition: A | Discharge status: Home / Self Care | Payer: MEDICAID | Attending: Emergency Medicine | Admitting: Emergency Medicine

## 2024-10-26 ENCOUNTER — Encounter (HOSPITAL_COMMUNITY): Payer: Self-pay | Admitting: Emergency Medicine

## 2024-10-26 ENCOUNTER — Other Ambulatory Visit: Payer: Self-pay

## 2024-10-26 DIAGNOSIS — Z23 Encounter for immunization: Secondary | ICD-10-CM | POA: Insufficient documentation

## 2024-10-26 DIAGNOSIS — T310 Burns involving less than 10% of body surface: Secondary | ICD-10-CM | POA: Insufficient documentation

## 2024-10-26 DIAGNOSIS — X102XXA Contact with fats and cooking oils, initial encounter: Secondary | ICD-10-CM | POA: Insufficient documentation

## 2024-10-26 DIAGNOSIS — T23261A Burn of second degree of back of right hand, initial encounter: Secondary | ICD-10-CM | POA: Insufficient documentation

## 2024-10-26 DIAGNOSIS — T25121A Burn of first degree of right foot, initial encounter: Secondary | ICD-10-CM | POA: Insufficient documentation

## 2024-10-26 MED ORDER — TETANUS-DIPHTH-ACELL PERTUSSIS 5-2-15.5 LF-MCG/0.5 IM SUSP
0.5000 mL | Freq: Once | INTRAMUSCULAR | Status: AC
  Administered 2024-10-26: 0.5 mL via INTRAMUSCULAR
  Filled 2024-10-26: qty 0.5

## 2024-10-26 MED ORDER — OXYCODONE-ACETAMINOPHEN 5-325 MG PO TABS: 1.0000 | ORAL_TABLET | Freq: Four times a day (QID) | ORAL | 0 refills | Status: AC | PRN

## 2024-10-26 MED ORDER — LIDOCAINE 5 % EX OINT: 1.0000 | TOPICAL_OINTMENT | CUTANEOUS | 0 refills | Status: AC | PRN

## 2024-10-26 MED ORDER — FENTANYL CITRATE (PF) 100 MCG/2ML IJ SOLN
25.0000 ug | Freq: Once | INTRAMUSCULAR | Status: AC
  Administered 2024-10-26: 25 ug via INTRAVENOUS
  Filled 2024-10-26: qty 2

## 2024-10-26 MED ORDER — FENTANYL CITRATE (PF) 100 MCG/2ML IJ SOLN
50.0000 ug | Freq: Once | INTRAMUSCULAR | Status: AC
  Administered 2024-10-26: 50 ug via INTRAVENOUS
  Filled 2024-10-26: qty 2

## 2024-10-26 MED ORDER — LIDOCAINE HCL URETHRAL/MUCOSAL 2 % EX GEL
1.0000 | Freq: Once | CUTANEOUS | Status: AC
  Administered 2024-10-26: 1 via TOPICAL
  Filled 2024-10-26: qty 5
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
